# Patient Record
Sex: Female | Born: 1950 | Race: Black or African American | Hispanic: No | Smoking: Former smoker
Health system: Southern US, Community
[De-identification: ages and names within clinical notes are randomized; demographics above are authoritative.]

## PROBLEM LIST (undated history)

## (undated) DIAGNOSIS — R011 Cardiac murmur, unspecified: Secondary | ICD-10-CM

## (undated) DIAGNOSIS — R112 Nausea with vomiting, unspecified: Secondary | ICD-10-CM

## (undated) DIAGNOSIS — M1990 Unspecified osteoarthritis, unspecified site: Secondary | ICD-10-CM

## (undated) DIAGNOSIS — I10 Essential (primary) hypertension: Secondary | ICD-10-CM

## (undated) DIAGNOSIS — R51 Headache: Secondary | ICD-10-CM

## (undated) DIAGNOSIS — Z9889 Other specified postprocedural states: Secondary | ICD-10-CM

## (undated) HISTORY — PX: CARPAL TUNNEL RELEASE: SHX101

## (undated) HISTORY — PX: CHOLECYSTECTOMY: SHX55

## (undated) HISTORY — PX: BREAST BIOPSY: SHX20

## (undated) HISTORY — PX: CERVICAL FUSION: SHX112

## (undated) HISTORY — PX: ABDOMINAL HYSTERECTOMY: SHX81

---

## 2008-09-17 ENCOUNTER — Encounter: Admission: RE | Admit: 2008-09-17 | Discharge: 2008-09-17 | Payer: Self-pay | Admitting: Family Medicine

## 2010-05-29 ENCOUNTER — Ambulatory Visit: Payer: Self-pay | Admitting: Diagnostic Radiology

## 2010-05-29 ENCOUNTER — Ambulatory Visit (HOSPITAL_BASED_OUTPATIENT_CLINIC_OR_DEPARTMENT_OTHER): Admission: RE | Admit: 2010-05-29 | Discharge: 2010-05-29 | Payer: Self-pay | Admitting: Family Medicine

## 2010-06-12 ENCOUNTER — Ambulatory Visit (HOSPITAL_BASED_OUTPATIENT_CLINIC_OR_DEPARTMENT_OTHER)
Admission: RE | Admit: 2010-06-12 | Discharge: 2010-06-12 | Payer: Self-pay | Source: Home / Self Care | Admitting: Family Medicine

## 2010-07-21 ENCOUNTER — Inpatient Hospital Stay (HOSPITAL_COMMUNITY)
Admission: RE | Admit: 2010-07-21 | Discharge: 2010-07-24 | Payer: Self-pay | Source: Home / Self Care | Attending: Neurosurgery | Admitting: Neurosurgery

## 2010-07-26 LAB — GLUCOSE, CAPILLARY
Glucose-Capillary: 145 mg/dL — ABNORMAL HIGH (ref 70–99)
Glucose-Capillary: 158 mg/dL — ABNORMAL HIGH (ref 70–99)
Glucose-Capillary: 159 mg/dL — ABNORMAL HIGH (ref 70–99)
Glucose-Capillary: 161 mg/dL — ABNORMAL HIGH (ref 70–99)
Glucose-Capillary: 172 mg/dL — ABNORMAL HIGH (ref 70–99)
Glucose-Capillary: 175 mg/dL — ABNORMAL HIGH (ref 70–99)
Glucose-Capillary: 196 mg/dL — ABNORMAL HIGH (ref 70–99)
Glucose-Capillary: 212 mg/dL — ABNORMAL HIGH (ref 70–99)
Glucose-Capillary: 118 mg/dL — ABNORMAL HIGH (ref 70–99)
Glucose-Capillary: 125 mg/dL — ABNORMAL HIGH (ref 70–99)
Glucose-Capillary: 131 mg/dL — ABNORMAL HIGH (ref 70–99)
Glucose-Capillary: 131 mg/dL — ABNORMAL HIGH (ref 70–99)
Glucose-Capillary: 132 mg/dL — ABNORMAL HIGH (ref 70–99)
Glucose-Capillary: 170 mg/dL — ABNORMAL HIGH (ref 70–99)
Glucose-Capillary: 273 mg/dL — ABNORMAL HIGH (ref 70–99)

## 2010-07-26 LAB — BASIC METABOLIC PANEL
BUN: 16 mg/dL (ref 6–23)
CO2: 30 mEq/L (ref 19–32)
Calcium: 10.1 mg/dL (ref 8.4–10.5)
Chloride: 98 mEq/L (ref 96–112)
Creatinine, Ser: 1.16 mg/dL (ref 0.4–1.2)
GFR calc Af Amer: 58 mL/min — ABNORMAL LOW (ref >60)
GFR calc non Af Amer: 48 mL/min — ABNORMAL LOW (ref >60)
Glucose, Bld: 144 mg/dL — ABNORMAL HIGH (ref 70–99)
Potassium: 4.2 mEq/L (ref 3.5–5.1)
Sodium: 139 mEq/L (ref 135–145)

## 2010-07-26 LAB — CBC
HCT: 38.4 % (ref 36.0–46.0)
Hemoglobin: 12.4 g/dL (ref 12.0–15.0)
MCH: 28.4 pg (ref 26.0–34.0)
MCHC: 32.3 g/dL (ref 30.0–36.0)
MCV: 87.9 fL (ref 78.0–100.0)
Platelets: 271 10*3/uL (ref 150–400)
RBC: 4.37 MIL/uL (ref 3.87–5.11)
RDW: 13 % (ref 11.5–15.5)
WBC: 5 10*3/uL (ref 4.0–10.5)

## 2010-07-26 LAB — SURGICAL PCR SCREEN
MRSA, PCR: NEGATIVE
Staphylococcus aureus: NEGATIVE

## 2010-08-02 NOTE — Discharge Summary (Signed)
  NAME:  Audrey [name], Audrey [name]            ACCOUNT NO.:  [id number]  MEDICAL RECORD NO.:  [id number]          PATIENT TYPE:  INP  LOCATION:  3033                         FACILITY:  [facility]  PHYSICIAN:  Hilda Lias, M.D.   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  CLINICAL HISTORY:  The patient was admitted because of pain, weakness and difficulty with balance.  X-ray showed that she had severe stenosis from C3-C6.  Surgery was advised.  Laboratory normal.  COURSE IN THE HOSPITAL:  The patient was taken to Surgery, decompression at the level of 3-4, 4-5, 5-6 fusion was done.  Today, she is feeling much better.  She is able to walk with less discomfort and more stating. The x-ray from yesterday showed good position of the graft at the plate. She is going to be discharged today to be followed by me in my office.  CONDITION ON DISCHARGE:  Improvement.  MEDICATIONS:  Percocet and diazepam.  DIET:  Regular.  ACTIVITY:  Not to drive for at least 2 weeks.  FOLLOWUP:  Three weeks.          ______________________________ Hilda Lias, M.D.     EB/MEDQ  D:  07/24/2010  T:  07/25/2010  Job:  [id number]  Electronically Signed by Hilda Lias M.D. on 08/02/2010 05:21:55 PM

## 2012-01-26 ENCOUNTER — Other Ambulatory Visit: Payer: Self-pay | Admitting: Neurosurgery

## 2012-03-16 ENCOUNTER — Encounter (HOSPITAL_COMMUNITY): Payer: Self-pay | Admitting: Pharmacy Technician

## 2012-03-19 ENCOUNTER — Encounter (HOSPITAL_COMMUNITY)
Admission: RE | Admit: 2012-03-19 | Discharge: 2012-03-19 | Disposition: A | Discharge status: Home / Self Care | Payer: BC Managed Care – PPO | Source: Ambulatory Visit | Attending: Neurosurgery | Admitting: Neurosurgery

## 2012-03-19 ENCOUNTER — Ambulatory Visit (HOSPITAL_COMMUNITY)
Admission: RE | Admit: 2012-03-19 | Discharge: 2012-03-19 | Disposition: A | Discharge status: Home / Self Care | Payer: BC Managed Care – PPO | Source: Ambulatory Visit | Attending: Neurosurgery | Admitting: Neurosurgery

## 2012-03-19 ENCOUNTER — Encounter (HOSPITAL_COMMUNITY): Payer: Self-pay

## 2012-03-19 DIAGNOSIS — Z01818 Encounter for other preprocedural examination: Secondary | ICD-10-CM | POA: Insufficient documentation

## 2012-03-19 DIAGNOSIS — Z01812 Encounter for preprocedural laboratory examination: Secondary | ICD-10-CM | POA: Insufficient documentation

## 2012-03-19 DIAGNOSIS — Z01810 Encounter for preprocedural cardiovascular examination: Secondary | ICD-10-CM | POA: Insufficient documentation

## 2012-03-19 HISTORY — DX: Headache: R51

## 2012-03-19 HISTORY — DX: Unspecified osteoarthritis, unspecified site: M19.90

## 2012-03-19 HISTORY — DX: Essential (primary) hypertension: I10

## 2012-03-19 HISTORY — DX: Other specified postprocedural states: Z98.890

## 2012-03-19 HISTORY — DX: Other specified postprocedural states: R11.2

## 2012-03-19 LAB — BASIC METABOLIC PANEL
Calcium: 10.4 mg/dL (ref 8.4–10.5)
Creatinine, Ser: 1.17 mg/dL — ABNORMAL HIGH (ref 0.50–1.10)
GFR calc Af Amer: 57 mL/min — ABNORMAL LOW (ref >90)
GFR calc non Af Amer: 49 mL/min — ABNORMAL LOW (ref >90)
Sodium: 138 mEq/L (ref 135–145)

## 2012-03-19 LAB — SURGICAL PCR SCREEN
MRSA, PCR: NEGATIVE
Staphylococcus aureus: NEGATIVE

## 2012-03-19 LAB — CBC
MCV: 85.5 fL (ref 78.0–100.0)
Platelets: 229 10*3/uL (ref 150–400)
RDW: 13.5 % (ref 11.5–15.5)
WBC: 5.6 10*3/uL (ref 4.0–10.5)

## 2012-03-19 LAB — TYPE AND SCREEN: ABO/RH(D): O POS

## 2012-03-19 NOTE — Pre-Procedure Instructions (Addendum)
20 ELIRA COLASANTI  03/19/2012   Your procedure is scheduled on:  9/20  Report to [facility] at530 AM.  Call this number if you have problems the morning of surgery: [phone]   Remember:   Do not eat food or drink:After Midnight.    Take these medicines the morning of surgery with A SIP OF WATER: , zyrtec, metoprolol-hydrochlorithiazide, nasonex             Stop any aspirin, herbal meds, nsaids 5-7 days before surgery  Do not wear jewelry, make-up or nail polish.  Do not wear lotions, powders, or perfumes. You may wear deodorant.  Do not shave 48 hours prior to surgery. Men may shave face and neck.  Do not bring valuables to the hospital.  Contacts, dentures or bridgework may not be worn into surgery.  Leave suitcase in the car. After surgery it may be brought to your room.  For patients admitted to the hospital, checkout time is 11:00 AM the day of discharge.   Patients discharged the day of surgery will not be allowed to drive home.  Name and phone number of your driver:   Special Instructions: CHG Shower Use Special Wash: 1/2 bottle night before surgery and 1/2 bottle morning of surgery.   Please read over the following fact sheets that you were given: Pain Booklet, Coughing and Deep Breathing, Blood Transfusion Information and MRSA Information

## 2012-03-19 NOTE — Progress Notes (Signed)
REQUESTED STRESS TEST, EKG, ECHO FROM 2010  (DR FRIED )  [phone].

## 2012-03-29 MED ORDER — CEFAZOLIN SODIUM-DEXTROSE 2-3 GM-% IV SOLR
2.0000 g | INTRAVENOUS | Status: AC
  Administered 2012-03-30: 2 g via INTRAVENOUS

## 2012-03-30 ENCOUNTER — Encounter (HOSPITAL_COMMUNITY): Payer: Self-pay | Admitting: *Deleted

## 2012-03-30 ENCOUNTER — Ambulatory Visit (HOSPITAL_COMMUNITY): Payer: BC Managed Care – PPO

## 2012-03-30 ENCOUNTER — Encounter (HOSPITAL_COMMUNITY): Payer: Self-pay | Admitting: Anesthesiology

## 2012-03-30 ENCOUNTER — Encounter (HOSPITAL_COMMUNITY)
Admission: RE | Disposition: A | Discharge status: Home Health | Payer: Self-pay | Source: Ambulatory Visit | Attending: Neurosurgery

## 2012-03-30 ENCOUNTER — Inpatient Hospital Stay (HOSPITAL_COMMUNITY)
Admission: RE | Admit: 2012-03-30 | Discharge: 2012-04-04 | DRG: 756 | Disposition: A | Discharge status: Home Health | Payer: BC Managed Care – PPO | Source: Ambulatory Visit | Attending: Neurosurgery | Admitting: Neurosurgery

## 2012-03-30 ENCOUNTER — Ambulatory Visit (HOSPITAL_COMMUNITY): Payer: BC Managed Care – PPO | Admitting: Anesthesiology

## 2012-03-30 DIAGNOSIS — E119 Type 2 diabetes mellitus without complications: Secondary | ICD-10-CM | POA: Diagnosis present

## 2012-03-30 DIAGNOSIS — M48062 Spinal stenosis, lumbar region with neurogenic claudication: Principal | ICD-10-CM | POA: Diagnosis present

## 2012-03-30 DIAGNOSIS — I10 Essential (primary) hypertension: Secondary | ICD-10-CM | POA: Diagnosis present

## 2012-03-30 DIAGNOSIS — Z79899 Other long term (current) drug therapy: Secondary | ICD-10-CM

## 2012-03-30 DIAGNOSIS — M129 Arthropathy, unspecified: Secondary | ICD-10-CM | POA: Diagnosis present

## 2012-03-30 DIAGNOSIS — Q762 Congenital spondylolisthesis: Secondary | ICD-10-CM

## 2012-03-30 DIAGNOSIS — Z981 Arthrodesis status: Secondary | ICD-10-CM

## 2012-03-30 DIAGNOSIS — E669 Obesity, unspecified: Secondary | ICD-10-CM | POA: Diagnosis present

## 2012-03-30 LAB — GLUCOSE, CAPILLARY: Glucose-Capillary: 141 mg/dL — ABNORMAL HIGH (ref 70–99)

## 2012-03-30 SURGERY — POSTERIOR LUMBAR FUSION 1 LEVEL
Anesthesia: General | Site: Back | Wound class: Clean

## 2012-03-30 MED ORDER — DIPHENHYDRAMINE HCL 12.5 MG/5ML PO ELIX: 12.5000 mg | ORAL_SOLUTION | Freq: Four times a day (QID) | ORAL | Status: DC | PRN

## 2012-03-30 MED ORDER — NALOXONE HCL 0.4 MG/ML IJ SOLN: 0.4000 mg | INTRAMUSCULAR | Status: DC | PRN

## 2012-03-30 MED ORDER — SCOPOLAMINE 1 MG/3DAYS TD PT72
MEDICATED_PATCH | TRANSDERMAL | Status: AC
  Administered 2012-03-30: 1 via TRANSDERMAL
  Filled 2012-03-30: qty 1

## 2012-03-30 MED ORDER — MORPHINE SULFATE (PF) 1 MG/ML IV SOLN
INTRAVENOUS | Status: AC
  Filled 2012-03-30: qty 25

## 2012-03-30 MED ORDER — EPHEDRINE SULFATE 50 MG/ML IJ SOLN: INTRAMUSCULAR | Status: DC | PRN

## 2012-03-30 MED ORDER — ONDANSETRON HCL 4 MG/2ML IJ SOLN: 4.0000 mg | Freq: Four times a day (QID) | INTRAMUSCULAR | Status: DC | PRN

## 2012-03-30 MED ORDER — ONDANSETRON HCL 4 MG/2ML IJ SOLN
INTRAMUSCULAR | Status: DC | PRN
  Administered 2012-03-30: 4 mg via INTRAVENOUS

## 2012-03-30 MED ORDER — MORPHINE SULFATE (PF) 1 MG/ML IV SOLN
INTRAVENOUS | Status: DC
  Administered 2012-03-30: 17:00:00 via INTRAVENOUS
  Administered 2012-03-30: 4.5 mg via INTRAVENOUS
  Administered 2012-03-30: 1 mg via INTRAVENOUS
  Administered 2012-03-31: 3 mg via INTRAVENOUS
  Administered 2012-03-31: 4.09 mg via INTRAVENOUS
  Administered 2012-03-31: 1.5 mg via INTRAVENOUS
  Administered 2012-03-31 (×2): 6 mg via INTRAVENOUS
  Administered 2012-03-31 – 2012-04-01 (×2): 1.5 mg via INTRAVENOUS
  Filled 2012-03-30: qty 25

## 2012-03-30 MED ORDER — SODIUM CHLORIDE 0.9 % IJ SOLN: 9.0000 mL | INTRAMUSCULAR | Status: DC | PRN

## 2012-03-30 MED ORDER — SCOPOLAMINE 1 MG/3DAYS TD PT72
MEDICATED_PATCH | TRANSDERMAL | Status: AC
  Filled 2012-03-30: qty 1

## 2012-03-30 MED ORDER — NEOSTIGMINE METHYLSULFATE 1 MG/ML IJ SOLN
INTRAMUSCULAR | Status: DC | PRN
  Administered 2012-03-30: 4 mg via INTRAVENOUS

## 2012-03-30 MED ORDER — LIDOCAINE HCL (CARDIAC) 20 MG/ML IV SOLN
INTRAVENOUS | Status: DC | PRN
  Administered 2012-03-30: 75 mg via INTRAVENOUS

## 2012-03-30 MED ORDER — HEPARIN SODIUM (PORCINE) 1000 UNIT/ML IJ SOLN
INTRAMUSCULAR | Status: AC
  Filled 2012-03-30: qty 1

## 2012-03-30 MED ORDER — PROPOFOL 10 MG/ML IV BOLUS
INTRAVENOUS | Status: DC | PRN
  Administered 2012-03-30: 180 mg via INTRAVENOUS

## 2012-03-30 MED ORDER — DIPHENHYDRAMINE HCL 50 MG/ML IJ SOLN: 12.5000 mg | Freq: Four times a day (QID) | INTRAMUSCULAR | Status: DC | PRN

## 2012-03-30 MED ORDER — HYDROMORPHONE HCL PF 1 MG/ML IJ SOLN
0.2500 mg | INTRAMUSCULAR | Status: DC | PRN
  Administered 2012-03-30 (×3): 0.5 mg via INTRAVENOUS

## 2012-03-30 MED ORDER — METOPROLOL TARTRATE 50 MG PO TABS
50.0000 mg | ORAL_TABLET | Freq: Two times a day (BID) | ORAL | Status: DC
  Administered 2012-03-30 – 2012-04-04 (×9): 50 mg via ORAL
  Filled 2012-03-30 (×12): qty 1

## 2012-03-30 MED ORDER — SODIUM CHLORIDE 0.9 % IJ SOLN: 3.0000 mL | INTRAMUSCULAR | Status: DC | PRN

## 2012-03-30 MED ORDER — SODIUM CHLORIDE 0.9 % IV SOLN
INTRAVENOUS | Status: DC
  Administered 2012-03-30 – 2012-03-31 (×2): via INTRAVENOUS

## 2012-03-30 MED ORDER — HYDROCHLOROTHIAZIDE 25 MG PO TABS
25.0000 mg | ORAL_TABLET | Freq: Two times a day (BID) | ORAL | Status: DC
  Administered 2012-03-30 – 2012-04-04 (×10): 25 mg via ORAL
  Filled 2012-03-30 (×12): qty 1

## 2012-03-30 MED ORDER — 0.9 % SODIUM CHLORIDE (POUR BTL) OPTIME
TOPICAL | Status: DC | PRN
  Administered 2012-03-30: 1000 mL

## 2012-03-30 MED ORDER — METFORMIN HCL 500 MG PO TABS: 1000.0000 mg | ORAL_TABLET | Freq: Every day | ORAL | Status: DC

## 2012-03-30 MED ORDER — EPHEDRINE SULFATE 50 MG/ML IJ SOLN
INTRAMUSCULAR | Status: DC | PRN
  Administered 2012-03-30: 5 mg via INTRAVENOUS

## 2012-03-30 MED ORDER — AMLODIPINE BESY-BENAZEPRIL HCL 5-20 MG PO CAPS
1.0000 | ORAL_CAPSULE | Freq: Every day | ORAL | Status: DC
Start: 2012-03-30 — End: 2012-03-30

## 2012-03-30 MED ORDER — ACETAMINOPHEN 650 MG RE SUPP: 650.0000 mg | RECTAL | Status: DC | PRN

## 2012-03-30 MED ORDER — BENAZEPRIL HCL 20 MG PO TABS
20.0000 mg | ORAL_TABLET | Freq: Every day | ORAL | Status: DC
  Administered 2012-03-31 – 2012-04-04 (×3): 20 mg via ORAL
  Filled 2012-03-30 (×5): qty 1

## 2012-03-30 MED ORDER — FENTANYL CITRATE 0.05 MG/ML IJ SOLN
INTRAMUSCULAR | Status: DC | PRN
  Administered 2012-03-30: 50 ug via INTRAVENOUS
  Administered 2012-03-30: 100 ug via INTRAVENOUS
  Administered 2012-03-30 (×3): 50 ug via INTRAVENOUS

## 2012-03-30 MED ORDER — HYDROMORPHONE HCL PF 1 MG/ML IJ SOLN
INTRAMUSCULAR | Status: AC
  Administered 2012-03-30: 0.5 mg via INTRAVENOUS
  Filled 2012-03-30: qty 1

## 2012-03-30 MED ORDER — MENTHOL 3 MG MT LOZG: 1.0000 | LOZENGE | OROMUCOSAL | Status: DC | PRN

## 2012-03-30 MED ORDER — ROCURONIUM BROMIDE 100 MG/10ML IV SOLN
INTRAVENOUS | Status: DC | PRN
  Administered 2012-03-30: 50 mg via INTRAVENOUS

## 2012-03-30 MED ORDER — MIDAZOLAM HCL 5 MG/5ML IJ SOLN
INTRAMUSCULAR | Status: DC | PRN
  Administered 2012-03-30: 2 mg via INTRAVENOUS

## 2012-03-30 MED ORDER — METOPROLOL-HYDROCHLOROTHIAZIDE 50-25 MG PO TABS: 1.0000 | ORAL_TABLET | Freq: Two times a day (BID) | ORAL | Status: DC

## 2012-03-30 MED ORDER — ACETAMINOPHEN 325 MG PO TABS: 650.0000 mg | ORAL_TABLET | ORAL | Status: DC | PRN

## 2012-03-30 MED ORDER — THROMBIN 20000 UNITS EX KIT
PACK | CUTANEOUS | Status: DC | PRN
  Administered 2012-03-30: 20000 [IU] via TOPICAL

## 2012-03-30 MED ORDER — SODIUM CHLORIDE 0.9 % IV SOLN: 250.0000 mL | INTRAVENOUS | Status: DC

## 2012-03-30 MED ORDER — OXYCODONE-ACETAMINOPHEN 5-325 MG PO TABS
1.0000 | ORAL_TABLET | ORAL | Status: DC | PRN
  Administered 2012-03-31 – 2012-04-01 (×4): 1 via ORAL
  Administered 2012-04-02 (×3): 2 via ORAL
  Administered 2012-04-02: 1 via ORAL
  Administered 2012-04-03 – 2012-04-04 (×5): 2 via ORAL
  Filled 2012-03-30: qty 1
  Filled 2012-03-30 (×2): qty 2
  Filled 2012-03-30: qty 1
  Filled 2012-03-30 (×2): qty 2
  Filled 2012-03-30: qty 1
  Filled 2012-03-30: qty 2
  Filled 2012-03-30 (×2): qty 1
  Filled 2012-03-30 (×3): qty 2

## 2012-03-30 MED ORDER — ONDANSETRON HCL 4 MG/2ML IJ SOLN: 4.0000 mg | INTRAMUSCULAR | Status: DC | PRN

## 2012-03-30 MED ORDER — PHENYLEPHRINE HCL 10 MG/ML IJ SOLN
INTRAMUSCULAR | Status: DC | PRN
  Administered 2012-03-30 (×5): 80 ug via INTRAVENOUS

## 2012-03-30 MED ORDER — AMLODIPINE BESYLATE 5 MG PO TABS
5.0000 mg | ORAL_TABLET | Freq: Every day | ORAL | Status: DC
  Administered 2012-03-31 – 2012-04-04 (×3): 5 mg via ORAL
  Filled 2012-03-30 (×2): qty 1

## 2012-03-30 MED ORDER — PHENOL 1.4 % MT LIQD: 1.0000 | OROMUCOSAL | Status: DC | PRN

## 2012-03-30 MED ORDER — BUPIVACAINE-EPINEPHRINE PF 0.5-1:200000 % IJ SOLN
INTRAMUSCULAR | Status: DC | PRN
  Administered 2012-03-30: 30 mL

## 2012-03-30 MED ORDER — DIAZEPAM 5 MG PO TABS
5.0000 mg | ORAL_TABLET | Freq: Four times a day (QID) | ORAL | Status: DC | PRN
  Administered 2012-03-31 – 2012-04-04 (×6): 5 mg via ORAL
  Filled 2012-03-30 (×6): qty 1

## 2012-03-30 MED ORDER — METFORMIN HCL 500 MG PO TABS
1000.0000 mg | ORAL_TABLET | Freq: Every day | ORAL | Status: DC
  Administered 2012-03-31 – 2012-04-04 (×5): 1000 mg via ORAL
  Filled 2012-03-30 (×7): qty 2

## 2012-03-30 MED ORDER — HEMOSTATIC AGENTS (NO CHARGE) OPTIME
TOPICAL | Status: DC | PRN
  Administered 2012-03-30: 1 via TOPICAL

## 2012-03-30 MED ORDER — CEFAZOLIN SODIUM 1-5 GM-% IV SOLN
1.0000 g | Freq: Three times a day (TID) | INTRAVENOUS | Status: AC
  Administered 2012-03-30 – 2012-03-31 (×2): 1 g via INTRAVENOUS
  Filled 2012-03-30 (×2): qty 50

## 2012-03-30 MED ORDER — GLYCOPYRROLATE 0.2 MG/ML IJ SOLN
INTRAMUSCULAR | Status: DC | PRN
  Administered 2012-03-30: 0.6 mg via INTRAVENOUS

## 2012-03-30 MED ORDER — LACTATED RINGERS IV SOLN
INTRAVENOUS | Status: DC | PRN
  Administered 2012-03-30 (×3): via INTRAVENOUS

## 2012-03-30 MED ORDER — ZOLPIDEM TARTRATE 10 MG PO TABS: 10.0000 mg | ORAL_TABLET | Freq: Every evening | ORAL | Status: DC | PRN

## 2012-03-30 MED ORDER — SODIUM CHLORIDE 0.9 % IJ SOLN
3.0000 mL | Freq: Two times a day (BID) | INTRAMUSCULAR | Status: DC
  Administered 2012-03-30 – 2012-03-31 (×2): 3 mL via INTRAVENOUS

## 2012-03-30 SURGICAL SUPPLY — 74 items
APL SKNCLS STERI-STRIP NONHPOA (GAUZE/BANDAGES/DRESSINGS) ×1
BENZOIN TINCTURE PRP APPL 2/3 (GAUZE/BANDAGES/DRESSINGS) ×2 IMPLANT
BLADE SURG ROTATE 9660 (MISCELLANEOUS) IMPLANT
BONE EQUIVA 5CC (Bone Implant) ×1 IMPLANT
BUR ACORN 6.0 (BURR) ×2 IMPLANT
BUR MATCHSTICK NEURO 3.0 LAGG (BURR) ×2 IMPLANT
CANISTER SUCTION 2500CC (MISCELLANEOUS) ×2 IMPLANT
CAP REVERE LOCKING (Cap) ×4 IMPLANT
CLOTH BEACON ORANGE TIMEOUT ST (SAFETY) ×2 IMPLANT
CONN CROSSLINK REV 38-50MM (Connector) ×2 IMPLANT
CONNECTOR CRSLINK REV 38-50MM (Connector) IMPLANT
CONT SPEC 4OZ CLIKSEAL STRL BL (MISCELLANEOUS) ×2 IMPLANT
COVER BACK TABLE 24X17X13 BIG (DRAPES) IMPLANT
COVER TABLE BACK 60X90 (DRAPES) ×2 IMPLANT
DRAPE C-ARM 42X72 X-RAY (DRAPES) ×4 IMPLANT
DRAPE LAPAROTOMY 100X72X124 (DRAPES) ×2 IMPLANT
DRAPE POUCH INSTRU U-SHP 10X18 (DRAPES) ×2 IMPLANT
DRSG PAD ABDOMINAL 8X10 ST (GAUZE/BANDAGES/DRESSINGS) ×1 IMPLANT
DURAPREP 26ML APPLICATOR (WOUND CARE) ×2 IMPLANT
ELECT BLADE 4.0 EZ CLEAN MEGAD (MISCELLANEOUS) ×2
ELECT REM PT RETURN 9FT ADLT (ELECTROSURGICAL) ×2
ELECTRODE BLDE 4.0 EZ CLN MEGD (MISCELLANEOUS) IMPLANT
ELECTRODE REM PT RTRN 9FT ADLT (ELECTROSURGICAL) ×1 IMPLANT
EVACUATOR 1/8 PVC DRAIN (DRAIN) IMPLANT
GAUZE SPONGE 4X4 16PLY XRAY LF (GAUZE/BANDAGES/DRESSINGS) ×3 IMPLANT
GLOVE BIOGEL M 8.0 STRL (GLOVE) ×2 IMPLANT
GLOVE BIOGEL PI IND STRL 8 (GLOVE) IMPLANT
GLOVE BIOGEL PI IND STRL 8.5 (GLOVE) IMPLANT
GLOVE BIOGEL PI INDICATOR 8 (GLOVE) ×1
GLOVE BIOGEL PI INDICATOR 8.5 (GLOVE) ×1
GLOVE ECLIPSE 7.5 STRL STRAW (GLOVE) ×4 IMPLANT
GLOVE ECLIPSE 8.5 STRL (GLOVE) ×1 IMPLANT
GLOVE EXAM NITRILE LRG STRL (GLOVE) IMPLANT
GLOVE EXAM NITRILE MD LF STRL (GLOVE) ×1 IMPLANT
GLOVE EXAM NITRILE XL STR (GLOVE) IMPLANT
GLOVE EXAM NITRILE XS STR PU (GLOVE) IMPLANT
GOWN BRE IMP SLV AUR LG STRL (GOWN DISPOSABLE) ×2 IMPLANT
GOWN BRE IMP SLV AUR XL STRL (GOWN DISPOSABLE) IMPLANT
GOWN STRL REIN 2XL LVL4 (GOWN DISPOSABLE) ×2 IMPLANT
KIT BASIN OR (CUSTOM PROCEDURE TRAY) ×2 IMPLANT
KIT ROOM TURNOVER OR (KITS) ×2 IMPLANT
MILL MEDIUM DISP (BLADE) ×1 IMPLANT
NDL HYPO 18GX1.5 BLUNT FILL (NEEDLE) IMPLANT
NDL HYPO 21X1.5 SAFETY (NEEDLE) IMPLANT
NDL HYPO 25X1 1.5 SAFETY (NEEDLE) IMPLANT
NEEDLE HYPO 18GX1.5 BLUNT FILL (NEEDLE) IMPLANT
NEEDLE HYPO 21X1.5 SAFETY (NEEDLE) IMPLANT
NEEDLE HYPO 25X1 1.5 SAFETY (NEEDLE) IMPLANT
NS IRRIG 1000ML POUR BTL (IV SOLUTION) ×2 IMPLANT
PACK LAMINECTOMY NEURO (CUSTOM PROCEDURE TRAY) ×2 IMPLANT
PAD ARMBOARD 7.5X6 YLW CONV (MISCELLANEOUS) ×6 IMPLANT
PATTIES SURGICAL .5 X1 (DISPOSABLE) ×2 IMPLANT
PATTIES SURGICAL .5 X3 (DISPOSABLE) IMPLANT
ROD REVERE 6.35 45MM (Rod) ×2 IMPLANT
SCREW REVERE 6.35 6.5MMX45 (Screw) ×2 IMPLANT
SCREW REVERE 6.5X50MM (Screw) ×2 IMPLANT
SPACER CALIBER 10X22 12-17 (Spacer) ×2 IMPLANT
SPONGE GAUZE 4X4 12PLY (GAUZE/BANDAGES/DRESSINGS) ×2 IMPLANT
SPONGE LAP 4X18 X RAY DECT (DISPOSABLE) IMPLANT
SPONGE NEURO XRAY DETECT 1X3 (DISPOSABLE) IMPLANT
SPONGE SURGIFOAM ABS GEL 100 (HEMOSTASIS) ×2 IMPLANT
STRIP CLOSURE SKIN 1/2X4 (GAUZE/BANDAGES/DRESSINGS) ×2 IMPLANT
SUT VIC AB 1 CT1 18XBRD ANBCTR (SUTURE) ×2 IMPLANT
SUT VIC AB 1 CT1 8-18 (SUTURE) ×4
SUT VIC AB 2-0 CP2 18 (SUTURE) ×2 IMPLANT
SUT VIC AB 3-0 SH 8-18 (SUTURE) ×2 IMPLANT
SYR 20CC LL (SYRINGE) IMPLANT
SYR 20ML ECCENTRIC (SYRINGE) ×2 IMPLANT
SYR 5ML LL (SYRINGE) IMPLANT
TAPE CLOTH SURG 4X10 WHT LF (GAUZE/BANDAGES/DRESSINGS) ×1 IMPLANT
TOWEL OR 17X24 6PK STRL BLUE (TOWEL DISPOSABLE) ×2 IMPLANT
TOWEL OR 17X26 10 PK STRL BLUE (TOWEL DISPOSABLE) ×2 IMPLANT
TRAY FOLEY CATH 14FRSI W/METER (CATHETERS) ×2 IMPLANT
WATER STERILE IRR 1000ML POUR (IV SOLUTION) ×2 IMPLANT

## 2012-03-30 NOTE — Anesthesia Postprocedure Evaluation (Signed)
Anesthesia Post Note  Patient: Audrey [name]  Procedure(s) Performed: Procedure(s) (LRB): POSTERIOR LUMBAR FUSION 1 LEVEL (N/A)  Anesthesia type: General  Patient location: PACU  Post pain: Pain level controlled and Adequate analgesia  Post assessment: Post-op Vital signs reviewed, Patient's Cardiovascular Status Stable, Respiratory Function Stable, Patent Airway and Pain level controlled  Last Vitals:  Filed Vitals:   03/30/12 1330  BP: 106/79  Pulse: 73  Temp:   Resp: 10    Post vital signs: Reviewed and stable  Level of consciousness: awake, alert  and oriented  Complications: No apparent anesthesia complications

## 2012-03-30 NOTE — Transfer of Care (Signed)
Immediate Anesthesia Transfer of Care Note  Patient: VIRGILIA QUIGG  Procedure(s) Performed: Procedure(s) (LRB) with comments: POSTERIOR LUMBAR FUSION 1 LEVEL (N/A) - Lumbar three , four , five  Laminectomy, Lumbar four - five Diskectomy, Cages, Pedicle screws, Posterolateral arthrodesis,   Patient Location: PACU  Anesthesia Type: General  Level of Consciousness: awake, alert , oriented and sedated  Airway & Oxygen Therapy: Patient Spontanous Breathing and Patient connected to nasal cannula oxygen  Post-op Assessment: Report given to PACU RN, Post -op Vital signs reviewed and stable and Patient moving all extremities  Post vital signs: Reviewed and stable  Complications: No apparent anesthesia complications

## 2012-03-30 NOTE — Anesthesia Preprocedure Evaluation (Signed)
Anesthesia Evaluation  Patient identified by MRN, date of birth, ID band Patient awake    Reviewed: Allergy & Precautions, H&P , NPO status , Patient's Chart, lab work & pertinent test results  History of Anesthesia Complications (+) PONV  Airway Mallampati: II  Neck ROM: full    Dental   Pulmonary          Cardiovascular hypertension,     Neuro/Psych  Headaches,    GI/Hepatic   Endo/Other  diabetesobese  Renal/GU      Musculoskeletal  (+) Arthritis -,   Abdominal   Peds  Hematology   Anesthesia Other Findings   Reproductive/Obstetrics                           Anesthesia Physical Anesthesia Plan  ASA: II  Anesthesia Plan: General   Post-op Pain Management:    Induction: Intravenous  Airway Management Planned: Oral ETT  Additional Equipment:   Intra-op Plan:   Post-operative Plan: Extubation in OR  Informed Consent: I have reviewed the patients History and Physical, chart, labs and discussed the procedure including the risks, benefits and alternatives for the proposed anesthesia with the patient or authorized representative who has indicated his/her understanding and acceptance.     Plan Discussed with: CRNA and Surgeon  Anesthesia Plan Comments:         Anesthesia Quick Evaluation

## 2012-03-30 NOTE — Preoperative (Signed)
Beta Blockers   Reason not to administer Beta Blockers:Not Applicable 

## 2012-03-30 NOTE — H&P (Signed)
Audrey [name] is an 61 y.o. female.   Chief Complaint: lbp HPI: lbp with radiation to both legs associated with numbness which is getting worse. She has had conservative treatment including epidurals.  Past Medical History  Diagnosis Date  . PONV (postoperative nausea and vomiting)   . Hypertension   . Diabetes mellitus   . Headache   . Arthritis     Past Surgical History  Procedure Date  . Cervical fusion     2 yrs ago   at [facility]  . Carpal tunnel release     right hand  . Abdominal hysterectomy   . Breast biopsy     right    History reviewed. No pertinent family history. Social History:  reports that she has quit smoking. She does not have any smokeless tobacco history on file. She reports that she does not drink alcohol or use illicit drugs.  Allergies: No Known Allergies  Medications Prior to Admission  Medication Sig Dispense Refill  . amLODipine-benazepril (LOTREL) 5-20 MG per capsule Take 1 capsule by mouth daily.      . Calcium-Magnesium-Zinc 167-83-8 MG TABS Take 1 tablet by mouth 2 (two) times daily.      . cetirizine (ZYRTEC) 10 MG tablet Take 10 mg by mouth daily as needed. allergies      . Cholecalciferol (VITAMIN D-3) 5000 UNITS TABS Take 1 tablet by mouth daily.      . metFORMIN (GLUCOPHAGE) 1000 MG tablet Take 1,000 mg by mouth daily.      . metoprolol-hydrochlorothiazide (LOPRESSOR HCT) 50-25 MG per tablet Take 1 tablet by mouth 2 (two) times daily.      . Multiple Vitamin (MULTIVITAMIN WITH MINERALS) TABS Take 1 tablet by mouth daily.      . mometasone (NASONEX) 50 MCG/ACT nasal spray Place 2 sprays into the nose daily as needed. Allergies        Results for orders placed during the hospital encounter of 03/30/12 (from the past 48 hour(s))  GLUCOSE, CAPILLARY     Status: Abnormal   Collection Time   03/30/12  6:23 AM      Component Value Range Comment   Glucose-Capillary 141 (*) 70 - 99 mg/dL    No results found.  Review of Systems  Respiratory:  Negative.   Cardiovascular: Negative.   Gastrointestinal: Negative.   Genitourinary: Negative.   Musculoskeletal: Positive for back pain.  Skin: Negative.   Neurological: Positive for focal weakness.  Endo/Heme/Allergies: Negative.   Psychiatric/Behavioral: Negative.     Blood pressure 146/85, pulse 68, temperature 98.3 F (36.8 C), temperature source Oral, resp. rate 16, SpO2 100.00%. Physical Exam hent, nl. Neck, ant scar. Cv, nl. Lungs clear. Abdomen, nl, extremiters, nl. NEURO weakness of df both feet. Sitting improves the pain but standing make it worse. Radiological studies shoewd stenosis l3 to l5 with grade 1 spondylolisthesis at l45  Assessment/Plan Decompressive laminectomies l3 to l5 with fusion at l45. Patient aware of risks and benefits.  Aaban Griep M 03/30/2012, 7:48 AM

## 2012-03-30 NOTE — Progress Notes (Signed)
Or note [phone]

## 2012-03-31 LAB — GLUCOSE, CAPILLARY: Glucose-Capillary: 122 mg/dL — ABNORMAL HIGH (ref 70–99)

## 2012-03-31 NOTE — Evaluation (Signed)
Physical Therapy Evaluation Patient Details Name: Audrey [name] MRN: [id number] DOB: Aug 21, 1950 Today's Date: 03/31/2012 Time: 1124-1209 PT Time Calculation (min): 45 min  PT Assessment / Plan / Recommendation Clinical Impression  61 yo s/p L3-5 laminectomies and L4-5 fusion with dependencies in mobility due to back precautions and pain. Will benefit from PT to incr independence prior to d/c home with 24/7 assist.    PT Assessment  Patient needs continued PT services    Follow Up Recommendations  Home health PT;Supervision/Assistance - 24 hour (vs no PT follow-up depending on progress)    Barriers to Discharge None      Equipment Recommendations  Rolling walker with 5" wheels    Recommendations for Other Services OT consult   Frequency Min 5X/week    Precautions / Restrictions Precautions Precautions: Back Precaution Booklet Issued: Yes (comment) Required Braces or Orthoses: Spinal Brace Spinal Brace: Lumbar corset;Applied in sitting position   Pertinent Vitals/Pain 5/10 back pain SaO2 94-96% on RA       Mobility  Bed Mobility Bed Mobility: Rolling Right;Right Sidelying to Sit;Sitting - Scoot to [place] of Bed Rolling Right: 4: Min assist;With rail Right Sidelying to Sit: 4: Min assist;With rails;HOB flat Sitting - Scoot to [place] of Bed: 4: Min guard Details for Bed Mobility Assistance: vc for technique to maintain back precautions; assist to pelvis/legs to prevent twisting Transfers Transfers: Sit to Stand;Stand to Sit Sit to Stand: 4: Min assist;With upper extremity assist;From bed Stand to Sit: 4: Min assist;With upper extremity assist;With armrests;To chair/3-in-1 Details for Transfer Assistance: pt moving slowly due to pain and required min assist to fully extend hips/knees as coming to standing and transitioning hands up to RW Ambulation/Gait Ambulation/Gait Assistance: 4: Min assist Ambulation Distance (Feet): 4 Feet Assistive device: Rolling  walker Ambulation/Gait Assistance Details: Pt very fatigued after prolonged sitting EOB while RN in to inspect (and then change) dressing on her back (saturated). Very lethargic/groggy and further ambulation deferred Gait Pattern: Step-to pattern;Decreased stride length;Shuffle    Exercises     PT Diagnosis: Difficulty walking;Acute pain  PT Problem List: Decreased activity tolerance;Decreased mobility;Decreased knowledge of use of DME;Decreased knowledge of precautions;Pain PT Treatment Interventions: DME instruction;Gait training;Stair training;Functional mobility training;Therapeutic activities;Patient/family education   PT Goals Acute Rehab PT Goals PT Goal Formulation: With patient Time For Goal Achievement: 04/07/12 Potential to Achieve Goals: Good Pt will Roll Supine to Right Side: with supervision PT Goal: Rolling Supine to Right Side - Progress: Goal set today Pt will go Supine/Side to Sit: with supervision;with HOB 0 degrees PT Goal: Supine/Side to Sit - Progress: Goal set today Pt will go Sit to Supine/Side: with supervision;with HOB 0 degrees PT Goal: Sit to Supine/Side - Progress: Goal set today Pt will go Sit to Stand: with supervision;with upper extremity assist PT Goal: Sit to Stand - Progress: Goal set today Pt will go Stand to Sit: with supervision;with upper extremity assist PT Goal: Stand to Sit - Progress: Goal set today Pt will Ambulate: 51 - 150 feet;with supervision;with least restrictive assistive device PT Goal: Ambulate - Progress: Goal set today Additional Goals Additional Goal #1: Pt will be able to verbalize and adhere to back precautions while mobilizing PT Goal: Additional Goal #1 - Progress: Goal set today  Visit Information  Last PT Received On: 03/31/12 Assistance Needed: +1    Subjective Data  Subjective: Pt reports she had some numbness in bil feel Lt worse than Rt PTA Patient Stated Goal: get back to her walking routine  and lose some weight    Prior Functioning  Home Living Lives With: Daughter;Other (Comment) (granddaughter) Available Help at Discharge: Available 24 hours/day;Family Type of Home: House Home Access: Stairs to enter [employer] of Steps: 5 Entrance Stairs-Rails: Right;Left Home Layout: One level Bathroom Shower/Tub: Forensic scientist: Standard Bathroom Accessibility: Yes How Accessible: Accessible via walker Home Adaptive Equipment: None Prior Function Level of Independence: Independent Able to Take Stairs?: Yes Driving: Yes Vocation: Retired Musician: No difficulties    Cognition  Overall Cognitive Status: Appears within functional limits for tasks assessed/performed Orientation Level: Oriented X4 / Intact Behavior During Session: Lethargic Cognition - Other Comments: using PCA; easily drifts off to sleep    Extremity/Trunk Assessment Right Lower Extremity Assessment RLE ROM/Strength/Tone: WFL for tasks assessed RLE Sensation: Deficits RLE Sensation Deficits: numbness in foot RLE Coordination: WFL - gross motor Left Lower Extremity Assessment LLE ROM/Strength/Tone: WFL for tasks assessed LLE Sensation: Deficits LLE Sensation Deficits: numbness foot and lower leg LLE Coordination: WFL - gross motor   Balance    End of Session PT - End of Session Equipment Utilized During Treatment: Gait belt;Back brace Activity Tolerance: Patient limited by fatigue Patient left: in chair;with call bell/phone within reach;with family/visitor present Nurse Communication: Mobility status  GP     [name] 03/31/2012, 1:07 PM  Pager [phone]

## 2012-03-31 NOTE — Progress Notes (Signed)
Postop day 1. Patient complains of back pain. No lower extremity pain.Marland Kitchen still requiring her PCA for pain relief.  She is afebrile. Vitals are stable. Urine output good. Awake and alert oriented and appropriate. Motor and sensory function intact. Dressing dry.  Progressing well following decompression and fusion surgery. Work towards mobilization. Continue PCA for now.

## 2012-03-31 NOTE — Op Note (Signed)
[name], [name]            ACCOUNT NO.:  [id number]  MEDICAL RECORD NO.:  [id number]  LOCATION:  4N13C                        FACILITY:  [facility]  PHYSICIAN:  Hilda Lias, M.D.   DATE OF BIRTH:  [birth year]  DATE OF PROCEDURE:  03/30/2012 DATE OF DISCHARGE:                              OPERATIVE REPORT   PREOPERATIVE DIAGNOSIS:  Lumbar stenosis.  Neurogenic claudication, L3 to L5-S1 with a grade 1, grade 2 spondylolisthesis, L4-5.  POSTOPERATIVE DIAGNOSIS:  Lumbar stenosis.  Neurogenic claudication, L3 to L5-S1 with a grade 1, grade 2 spondylolisthesis, L4-5.  PROCEDURE:  Bilateral L3, L4, L5 laminectomy, foraminotomy, bilateral L4 facetectomy, bilateral L4-L5 diskectomy, beyond what normally we do to be able to introduce the cages.  Total L4-L5 diskectomy, insertion of 2 cages.  Pedicle screws L4-L5.  Posterolateral arthrodesis with autograft and morselized bone, Cell Saver, C-arm.  SURGEON:  Hilda Lias, MD  ASSISTANT:  Stefani Dama, M.D.  CLINICAL HISTORY:  [name] is a 62 year old female complaining of back pain with radiation to both legs.  The patient gets worse when she walks associated with weakness of both legs.  She has failed with conservative treatment.  X-ray shows stenosis from L3 to L5-S1 with grade 1, grade 2 spondylolisthesis at L4-5.  Surgery was advised in view of no improvement.  DESCRIPTION OF PROCEDURE:  The patient was taken to the OR, and after intubation, she was positioned in a prone manner.  The back was cleaned with DuraPrep.  Drapes were applied.  Midline incision was made from L2- 3 down to L4-5 and muscles were retracted laterally.  X-rays were taken and showed that indeed we were right at the level of L4-5.  From then on, we removed the spinous process of L3, L4, L5, with the laminectomy of L3, L4, and L5.  At the level of L4-5, we proceeded with facetectomy of L4.  The facets were loose, the left was worse than the right  one. Then, we entered the disk space.  Total gross diskectomy at L4-5 medial and laterally beyond what normally we do to be able to introduce the cage.  The endplates were removed.  Then 2 cages of 22 in length expandable were inserted.  The expansion was carried all the way up to 15.  Then, the rest of the bone graft was introduced at the level of the disk space.  From then on, using the C-arm, first in AP view and then laterally, we found the pedicle of L4 and L5.  Then, we introduced 4 screws of 6.5 x 45 and 50.  Prior to introducing the screws, we probed the hole just to be sure that we were surrounded by bone.  The pedicles were held in place using a rod and caps.  Then, we removed the periosteum of the lateral aspect of the L4-5 and a mix of morselized bone as well as autograft was used for arthrodesis.  Hemostasis was accomplished and the wound was closed in multiple layers with Vicryl and Steri-Strips.          ______________________________ Hilda Lias, M.D.     EB/MEDQ  D:  03/30/2012  T:  03/31/2012  Job:  [id number]

## 2012-03-31 NOTE — Progress Notes (Signed)
Orthopedic Tech Progress Note Patient Details:  PRETTY DILEO 03-Apr-1951 [id number]  Patient ID: Adelene Amas, female   DOB: 1950/11/07, 61 y.o.   MRN: [id number] Called in bio-tech brace order @9 :30;brace order completed by Storm Frisk, Laray Rivkin 03/31/2012, 11:24 AM

## 2012-03-31 NOTE — Progress Notes (Signed)
During shift assessment, pt's dressing on the incision site was soaked with moderate amount of blood. Dressing removed and seen blood draining from the incision site. Pressure dressing applied with the use of abdominal pad and secured with surgical tape. Pt has no discomfort. @0300am  dressing still intact with minimal stain of blood. Will continue to monitor.

## 2012-04-01 LAB — GLUCOSE, CAPILLARY: Glucose-Capillary: 100 mg/dL — ABNORMAL HIGH (ref 70–99)

## 2012-04-01 NOTE — Progress Notes (Signed)
Occupational Therapy Evaluation Patient Details Name: Audrey [name] MRN: [id number] DOB: 06/15/1951 Today's Date: 04/01/2012 Time: 1005-1030 OT Time Calculation (min): 25 min  OT Assessment / Plan / Recommendation Clinical Impression  Pt L3-5 laminectomies and L4-5 fusion thus affecting PLOF. Will benefit from acute OT services to address below problem list in prep for d/c home.  Pt will have 24/7 assist for first week.     OT Assessment  Patient needs continued OT Services    Follow Up Recommendations  Home health OT;Supervision/Assistance - 24 hour    Barriers to Discharge      Equipment Recommendations  3 in 1 bedside comode;Tub/shower bench    Recommendations for Other Services    Frequency  Min 2X/week    Precautions / Restrictions Precautions Precautions: Back Precaution Booklet Issued: Yes (comment) Precaution Comments: Educated pt on 3/3 back precautions Required Braces or Orthoses: Spinal Brace Spinal Brace: Lumbar corset;Applied in sitting position   Pertinent Vitals/Pain See vitals    ADL  Lower Body Bathing: Simulated;Maximal assistance Where Assessed - Lower Body Bathing: Supported sit to stand Lower Body Dressing: Simulated;Maximal assistance Where Assessed - Lower Body Dressing: Supported sit to stand Toilet Transfer: Mining engineer Method: Sit to Barista:  (chair) Equipment Used: Rolling walker;Back brace Transfers/Ambulation Related to ADLs: Supervision with RW during ambulation. Requires increased time due to pain. ADL Comments: Pt requires increased time for all ADL tasks and mobility due to pain.      OT Diagnosis: Generalized weakness;Acute pain  OT Problem List: Decreased strength;Decreased activity tolerance;Impaired balance (sitting and/or standing);Decreased knowledge of use of DME or AE;Decreased knowledge of precautions;Pain OT Treatment Interventions: Self-care/ADL training;DME and/or  AE instruction;Therapeutic activities;Patient/family education;Balance training   OT Goals Acute Rehab OT Goals OT Goal Formulation: With patient Time For Goal Achievement: 04/08/12 Potential to Achieve Goals: Good ADL Goals Pt Will Perform Grooming: with modified independence;Standing at sink ADL Goal: Grooming - Progress: Goal set today Pt Will Perform Lower Body Bathing: with modified independence;Sit to stand from chair;Sit to stand from bed;with adaptive equipment ADL Goal: Lower Body Bathing - Progress: Goal set today Pt Will Perform Lower Body Dressing: with modified independence;Sit to stand from chair;Sit to stand from bed;with adaptive equipment ADL Goal: Lower Body Dressing - Progress: Goal set today Pt Will Transfer to Toilet: with modified independence;Ambulation;with DME;Comfort height toilet;Maintaining back safety precautions ADL Goal: Toilet Transfer - Progress: Goal set today Pt Will Perform Toileting - Clothing Manipulation: with modified independence;Sitting on 3-in-1 or toilet;Standing;with adaptive equipment ADL Goal: Toileting - Clothing Manipulation - Progress: Goal set today Pt Will Perform Toileting - Hygiene: with modified independence;Standing at 3-in-1/toilet;Sit to stand from 3-in-1/toilet ADL Goal: Toileting - Hygiene - Progress: Goal set today Pt Will Perform Tub/Shower Transfer: Tub transfer;Ambulation;with DME;Transfer tub bench;with modified independence;Maintaining back safety precautions ADL Goal: Tub/Shower Transfer - Progress: Goal set today Miscellaneous OT Goals Miscellaneous OT Goal #1: Pt will perform bed mobility with mod I in prep for EOB ADLs. OT Goal: Miscellaneous Goal #1 - Progress: Goal set today Miscellaneous OT Goal #2: Pt will don/doff back brace independently sitting EOB. OT Goal: Miscellaneous Goal #2 - Progress: Goal set today  Visit Information  Last OT Received On: 04/01/12 Assistance Needed: +1    Subjective Data      Prior  Functioning  Vision/Perception  Home Living Lives With: Daughter;Other (Comment) (grandaughter) Available Help at Discharge: Available 24 hours/day;Family (for first week) Type of Home: House Home Access: Stairs  to enter Entrance Stairs-Number of Steps: 5 Entrance Stairs-Rails: Right;Left Home Layout: One level Bathroom Shower/Tub: Forensic scientist: Standard Bathroom Accessibility: Yes How Accessible: Accessible via walker Home Adaptive Equipment: None Prior Function Level of Independence: Independent Able to Take Stairs?: Yes Driving: Yes Vocation: Retired Musician: No difficulties      Cognition  Overall Cognitive Status: Appears within functional limits for tasks assessed/performed Arousal/Alertness: Awake/alert Orientation Level: Oriented X4 / Intact Behavior During Session: WFL for tasks performed    Extremity/Trunk Assessment Right Upper Extremity Assessment RUE ROM/Strength/Tone: Within functional levels Left Upper Extremity Assessment LUE ROM/Strength/Tone: Within functional levels   Mobility  Shoulder Instructions  Bed Mobility Bed Mobility: Sit to Sidelying Right;Rolling Right;Right Sidelying to Sit Right Sidelying to Sit: 4: Min assist;HOB flat Sitting - Scoot to [place] of Bed: 4: Min guard Sit to Sidelying Right: 3: Mod assist;HOB flat Details for Bed Mobility Assistance: Verbalized and demonstrated log roll technique for pt.  Assist to LEs for support into bed. Pt requires increased time for scooting hips EOB. Transfers Transfers: Sit to Stand;Stand to Sit Sit to Stand: From bed;From chair/3-in-1;With armrests;With upper extremity assist;4: Min assist;From elevated surface Stand to Sit: 4: Min guard;To chair/3-in-1;To bed;With upper extremity assist;With armrests;To elevated surface Details for Transfer Assistance: Elevated bed to simulate pt's bed at home. Min assist to obtain full upright posture and for steadying.  VC for safe hand placement.       Exercise     Balance     End of Session OT - End of Session Equipment Utilized During Treatment: Gait belt;Back brace Activity Tolerance: Patient limited by pain Patient left: in chair;with call bell/phone within reach;with family/visitor present Nurse Communication: Mobility status  GO    04/01/2012 Cipriano Mile OTR/L Pager [phone] Office [phone]  Cipriano Mile 04/01/2012, 10:47 AM

## 2012-04-01 NOTE — Progress Notes (Signed)
Physical Therapy Treatment Patient Details Name: Audrey [name] MRN: [id number] DOB: [birth year] Today's Date: 04/01/2012 Time: 1000-1030 PT Time Calculation (min): 30 min  PT Assessment / Plan / Recommendation Comments on Treatment Session  Making solid progress today with incr amb distance and practicing bed mobility; Plans to have prn assist at home furst week    Follow Up Recommendations  Home health PT;Supervision/Assistance - 24 hour    Barriers to Discharge        Equipment Recommendations  3 in 1 bedside comode;Tub/shower bench;Rolling walker with 5" wheels    Recommendations for Other Services OT consult  Frequency Min 5X/week   Plan Discharge plan remains appropriate    Precautions / Restrictions Precautions Precautions: Back Precaution Booklet Issued: Yes (comment) Precaution Comments: Educated pt on 3/3 back precautions Required Braces or Orthoses: Spinal Brace Spinal Brace: Lumbar corset;Applied in sitting position   Pertinent Vitals/Pain 5/10 back pain with moving; repositioned in chair    Mobility  Bed Mobility Bed Mobility: Right Sidelying to Sit;Sit to Sidelying Right Right Sidelying to Sit: 4: Min guard (without physical contact) Sitting - Scoot to Edge of Bed: 4: Min guard Sit to Sidelying Right: 3: Mod assist;HOB flat Details for Bed Mobility Assistance: Verbalized and demonstrated log roll technique for pt.  Assist to LEs for support into bed. Pt requires increased time for scooting hips EOB. Transfers Transfers: Sit to Stand;Stand to Sit Sit to Stand: 4: Min guard;From bed;From chair/3-in-1 (without physical contact) Stand to Sit: 4: Min guard;To chair/3-in-1;To bed;With upper extremity assist;With armrests;To elevated surface Details for Transfer Assistance: Elevated bed to simulate pt's bed at home. Min assist to obtain full upright posture and for steadying. VC for safe hand placement. Ambulation/Gait Ambulation/Gait Assistance: 4: Min  guard Ambulation Distance (Feet): 35 Feet Assistive device: Rolling walker Ambulation/Gait Assistance Details: Cues for posture and to fully extend hips for fully upright standing; Slow pace, but good progress Gait Pattern: Decreased stride length    Exercises     PT Diagnosis:    PT Problem List:   PT Treatment Interventions:     PT Goals Acute Rehab PT Goals Time For Goal Achievement: 04/07/12 Potential to Achieve Goals: Good Pt will go Supine/Side to Sit: with supervision;with HOB 0 degrees PT Goal: Supine/Side to Sit - Progress: Progressing toward goal Pt will go Sit to Supine/Side: with supervision;with HOB 0 degrees PT Goal: Sit to Supine/Side - Progress: Progressing toward goal Pt will go Sit to Stand: with supervision;with upper extremity assist PT Goal: Sit to Stand - Progress: Progressing toward goal Pt will go Stand to Sit: with supervision;with upper extremity assist PT Goal: Stand to Sit - Progress: Progressing toward goal Pt will Ambulate: 51 - 150 feet;with supervision;with least restrictive assistive device PT Goal: Ambulate - Progress: Progressing toward goal Additional Goals Additional Goal #1: Pt will be able to verbalize and adhere to back precautions while mobilizing PT Goal: Additional Goal #1 - Progress: Progressing toward goal  Visit Information  Last PT Received On: 04/01/12 Assistance Needed: +1    Subjective Data  Subjective: Agreeable to amb Patient Stated Goal: get back to her walking routine and lose some weight   Cognition  Overall Cognitive Status: Appears within functional limits for tasks assessed/performed Arousal/Alertness: Awake/alert Orientation Level: Oriented X4 / Intact Behavior During Session: [facility] for tasks performed    Balance     End of Session PT - End of Session Equipment Utilized During Treatment: Gait belt;Back brace Activity Tolerance: Patient tolerated  treatment well Patient left: in chair;with call bell/phone within  reach;with family/visitor present Nurse Communication: Mobility status   GP     Van Clines [facility] [place], [place] [phone]  04/01/2012, 1:37 PM

## 2012-04-01 NOTE — Progress Notes (Signed)
Subjective: Patient reports Stable postoperatively still on PCA with Foley catheter  Objective: Vital signs in last 24 hours: Temp:  [98 F (36.7 C)-98.4 F (36.9 C)] 98 F (36.7 C) (09/22 0944) Pulse Rate:  [81-89] 81  (09/22 0944) Resp:  [18] 18  (09/22 0944) BP: (109-122)/(50-66) 122/53 mmHg (09/22 0944) SpO2:  [96 %-100 %] 96 % (09/22 0944)  Intake/Output from previous day:   Intake/Output this shift: Total I/O In: 360 [P.O.:360] Out: 1000 [Urine:1000]  Incision clean dry ambulating motor function intact in lower extremities  Lab Results: No results found for this basename: WBC:2,HGB:2,HCT:2,PLT:2 in the last 72 hours BMET No results found for this basename: NA:2,K:2,CL:2,CO2:2,GLUCOSE:2,BUN:2,CREATININE:2,CALCIUM:2 in the last 72 hours  Studies/Results: Dg Lumbar Spine 2-3 Views  03/30/2012  *RADIOLOGY REPORT*  Clinical Data: L4-5 PLIF.  OPERATIVE LUMBAR SPINE - 2-3 VIEW  Comparison:  Intraoperative localizer lumbar spine images earlier same date.  Lumbar spine x-rays 09/05/2011 [facility].  Findings: 2 spot films from the C-arm fluoroscopic device, AP and lateral views the lower lumbar spine, were submitted for interpretation post-operatively, demonstrating bilateral pedicle screws at L4 and L5 and interbody fusion plugs at L4-5.  IMPRESSION: Images obtained during L4-5 PLIF.   Original Report Authenticated By: Arnell Sieving, M.D.    Dg C-arm 1-60 Min  03/30/2012  *RADIOLOGY REPORT*  Clinical Data: L4-5 PLIF.  OPERATIVE LUMBAR SPINE - 2-3 VIEW  Comparison:  Intraoperative localizer lumbar spine images earlier same date.  Lumbar spine x-rays 09/05/2011 [facility].  Findings: 2 spot films from the C-arm fluoroscopic device, AP and lateral views the lower lumbar spine, were submitted for interpretation post-operatively, demonstrating bilateral pedicle screws at L4 and L5 and interbody fusion plugs at L4-5.  IMPRESSION: Images obtained during L4-5 PLIF.    Original Report Authenticated By: Arnell Sieving, M.D.     Assessment/Plan: Stable postop day 2  LOS: 2 days  Discontinue PCA and Foley catheter   Audrey [name] J 04/01/2012, 10:51 AM

## 2012-04-01 NOTE — Progress Notes (Signed)
CSW consult for SNF.  PT recommendation for home with [facility] and 24 hour supervision vs. no PT f/u pending progress noted. Also noted pt lives with family who is able to provide 24 hour supervision at d/c. CSW signing off as no other CSW needs identified at this time. Please re-consult if SNF needed.  Dellie Burns, MSW, LCSWA [phone] (Weekends 8:00am-4:30pm)

## 2012-04-02 DIAGNOSIS — M4716 Other spondylosis with myelopathy, lumbar region: Secondary | ICD-10-CM

## 2012-04-02 LAB — GLUCOSE, CAPILLARY
Glucose-Capillary: 114 mg/dL — ABNORMAL HIGH (ref 70–99)
Glucose-Capillary: 139 mg/dL — ABNORMAL HIGH (ref 70–99)

## 2012-04-02 MED ORDER — POLYETHYLENE GLYCOL 3350 17 G PO PACK
17.0000 g | PACK | Freq: Every day | ORAL | Status: DC
  Administered 2012-04-02 – 2012-04-04 (×3): 17 g via ORAL
  Filled 2012-04-02 (×3): qty 1

## 2012-04-02 NOTE — Care Management Note (Signed)
    Page 1 of 2   04/05/2012     10:44:23 AM   CARE MANAGEMENT NOTE 04/05/2012  Patient:  Audrey [name], Audrey [name]   Account Number:  [id number]  Date Initiated:  04/02/2012  Documentation initiated by:  Onnie Boer  Subjective/Objective Assessment:   PT WAS ADMITTED FOR BACK SURGERY     Action/Plan:   PROGRESSION OF CARE AND DISCHARGE PLANNING   Anticipated DC Date:  04/04/2012   Anticipated DC Plan:  HOME/SELF CARE      DC Planning Services  CM consult      Choice offered to / List presented to:  C-1 Patient   DME arranged  3-N-1  Levan Hurst      DME agency  [facility]     HH arranged  HH-2 PT  HH-3 OT      [facility] agency  [facility]   Status of service:  Completed, signed off Medicare Important Message given?   (If response is "NO", the following Medicare IM given date fields will be blank) Date Medicare IM given:   Date Additional Medicare IM given:    Discharge Disposition:  HOME W HOME HEALTH SERVICES  Per UR Regulation:  Reviewed for med. necessity/level of care/duration of stay  If discussed at Long Length of Stay Meetings, dates discussed:    Comments:  04/02/12 Onnie Boer, RN, BSN 1124 PT WAS ADMITTED FOR A FUSION.  PTA PT WAS AT HOME WITH SELF CARE AND HER FAMILY.  WILL F/U ON DC NEEDS AND RECOMMENDATIONS.

## 2012-04-02 NOTE — Progress Notes (Signed)
Physical Therapy Treatment Patient Details Name: Audrey [name] MRN: [id number] DOB: 01/10/1951 Today's Date: 04/02/2012 Time: 4098-1191 PT Time Calculation (min): 23 min  PT Assessment / Plan / Recommendation Comments on Treatment Session  Pt making slow, steady progress. Primarily limited by leg>back pain. Pt has 4-5 steps to enter home and will need stair training 9/24.     Follow Up Recommendations  Home health PT;Supervision/Assistance - 24 hour    Barriers to Discharge        Equipment Recommendations  3 in 1 bedside comode;Tub/shower bench;Rolling walker with 5" wheels    Recommendations for Other Services    Frequency Min 5X/week   Plan Discharge plan remains appropriate;Frequency remains appropriate    Precautions / Restrictions Precautions Precautions: Back Precaution Comments: Pt able to verbalize 2/3 prior to session and 3/3 at end of session Required Braces or Orthoses: Spinal Brace Spinal Brace: Lumbar corset;Applied in sitting position   Pertinent Vitals/Pain 5/10 RLE pain > back pain    Mobility  Bed Mobility Bed Mobility: Not assessed Details for Bed Mobility Assistance: Pt sitting on EOB after bath on arrival Transfers Transfers: Sit to Stand;Stand to Sit Sit to Stand: 4: Min assist;With upper extremity assist;From bed Stand to Sit: 4: Min guard;With upper extremity assist;With armrests;To chair/3-in-1 Details for Transfer Assistance: vc for safe hand placement/use of RW; pt with difficulty transitioning hands up to RW with RW beginning to tip to side and required PT to stabilize RW Ambulation/Gait Ambulation/Gait Assistance: 4: Min guard Ambulation Distance (Feet): 64 Feet Assistive device: Rolling walker Ambulation/Gait Assistance Details: vc for upright posture (pt tends to flex at hips and ? lumbar region) ; pt reports RLE pain is worse than back pain and educated on step-to pattern with RLE leading Gait Pattern: Step-to pattern;Decreased stride  length;Trunk flexed Gait velocity: significantly decr Stairs: No    Exercises     PT Diagnosis:    PT Problem List:   PT Treatment Interventions:     PT Goals Acute Rehab PT Goals Pt will go Sit to Stand: with supervision;with upper extremity assist PT Goal: Sit to Stand - Progress: Progressing toward goal Pt will go Stand to Sit: with supervision;with upper extremity assist PT Goal: Stand to Sit - Progress: Progressing toward goal Pt will Ambulate: 51 - 150 feet;with supervision;with least restrictive assistive device PT Goal: Ambulate - Progress: Progressing toward goal Pt will Go Up / Down Stairs: 3-5 stairs;with min assist;with rail(s) PT Goal: Up/Down Stairs - Progress: Goal set today Additional Goals Additional Goal #1: Pt will be able to verbalize and adhere to back precautions while mobilizing PT Goal: Additional Goal #1 - Progress: Progressing toward goal  Visit Information  Last PT Received On: 04/02/12 Assistance Needed: +1    Subjective Data  Subjective: Reports she sat up for several hours yesterday Patient Stated Goal: get back to her walking routine and lose some weight   Cognition  Overall Cognitive Status: Appears within functional limits for tasks assessed/performed    Balance     End of Session PT - End of Session Equipment Utilized During Treatment: Gait belt;Back brace Activity Tolerance: Patient limited by fatigue;Patient limited by pain Patient left: in chair;with call bell/phone within reach;with family/visitor present Nurse Communication: Mobility status   GP     Tabor Denham 04/02/2012, 9:47 AM Pager [phone]

## 2012-04-02 NOTE — Consult Note (Signed)
Physical Medicine and Rehabilitation Consult Reason for Consult: Back pain with neurogenic claudication Referring Physician:  Dr. Jeral Fruit.   HPI: Audrey [name] is a 61 y.o. female HTN, DM, LBP with radiation to BLE with numbness due to lumbar stenosis L3-L5 with neurogenic claudication. Patient elected to undergo lumbar lam with decompression by Dr. Jeral Fruit on 03/31/12. Post op continues to be limited by pain/muscle spasms BLE with slow progress. MD recommending CIR.   Review of Systems  Eyes: Negative for blurred vision and double vision.  Respiratory: Negative for shortness of breath and wheezing.   Cardiovascular: Negative for chest pain and palpitations.  Gastrointestinal: Positive for constipation. Negative for heartburn.  Musculoskeletal: Positive for myalgias and back pain.  Neurological: Positive for weakness. Negative for headaches.   Past Medical History  Diagnosis Date  . PONV (postoperative nausea and vomiting)   . Hypertension   . Diabetes mellitus   . Headache   . Arthritis    Past Surgical History  Procedure Date  . Cervical fusion     2 yrs ago   at [facility]  . Carpal tunnel release     right hand  . Abdominal hysterectomy   . Breast biopsy     right   History reviewed. No pertinent family history.  Social History:  Widowed. Daughter live with her (has the week off to assist) she reports that she has quit smoking when she was 35.  She does not have any smokeless tobacco history on file. She reports that she does not drink alcohol or use illicit drugs. Independent without AD PTA.   Allergies: No Known Allergies  Medications Prior to Admission  Medication Sig Dispense Refill  . amLODipine-benazepril (LOTREL) 5-20 MG per capsule Take 1 capsule by mouth daily.      . Calcium-Magnesium-Zinc 167-83-8 MG TABS Take 1 tablet by mouth 2 (two) times daily.      . cetirizine (ZYRTEC) 10 MG tablet Take 10 mg by mouth daily as needed. allergies      . Cholecalciferol  (VITAMIN D-3) 5000 UNITS TABS Take 1 tablet by mouth daily.      . metFORMIN (GLUCOPHAGE) 1000 MG tablet Take 1,000 mg by mouth daily.      . metoprolol-hydrochlorothiazide (LOPRESSOR HCT) 50-25 MG per tablet Take 1 tablet by mouth 2 (two) times daily.      . Multiple Vitamin (MULTIVITAMIN WITH MINERALS) TABS Take 1 tablet by mouth daily.      . mometasone (NASONEX) 50 MCG/ACT nasal spray Place 2 sprays into the nose daily as needed. Allergies        Home: Home Living Lives With: Daughter;Other (Comment) (grandaughter) Available Help at Discharge: Available 24 hours/day;Family (for first week) Type of Home: House Home Access: Stairs to enter [employer] of Steps: 5 Entrance Stairs-Rails: Right;Left Home Layout: One level Bathroom Shower/Tub: Forensic scientist: Standard Bathroom Accessibility: Yes How Accessible: Accessible via walker Home Adaptive Equipment: None  Functional History: Prior Function Able to Take Stairs?: Yes Driving: Yes Vocation: Retired Functional Status:  Mobility: Bed Mobility Bed Mobility: Not assessed Rolling Right: 4: Min assist;With rail Right Sidelying to Sit: 4: Min guard (without physical contact) Sitting - Scoot to [place] of Bed: 4: Min guard Sit to Sidelying Right: 3: Mod assist;HOB flat Transfers Transfers: Sit to Stand;Stand to Sit Sit to Stand: 4: Min assist;With upper extremity assist;From bed Stand to Sit: 4: Min guard;With upper extremity assist;With armrests;To chair/3-in-1 Ambulation/Gait Ambulation/Gait Assistance: 4: Min guard Ambulation Distance (Feet):  64 Feet Assistive device: Rolling walker Ambulation/Gait Assistance Details: vc for upright posture (pt tends to flex at hips and ? lumbar region) ; pt reports RLE pain is worse than back pain and educated on step-to pattern with RLE leading Gait Pattern: Step-to pattern;Decreased stride length;Trunk flexed Gait velocity: significantly decr Stairs: No     ADL: ADL Lower Body Bathing: Simulated;Maximal assistance Where Assessed - Lower Body Bathing: Supported sit to stand Lower Body Dressing: Simulated;Maximal assistance Where Assessed - Lower Body Dressing: Supported sit to Pharmacist, hospital: Mining engineer Method: Sit to Barista:  (chair) Equipment Used: Rolling walker;Back brace Transfers/Ambulation Related to ADLs: Supervision with RW during ambulation. Requires increased time due to pain. ADL Comments: Pt requires increased time for all ADL tasks and mobility due to pain.    Cognition: Cognition Arousal/Alertness: Awake/alert Orientation Level: Oriented X4 Cognition Overall Cognitive Status: Appears within functional limits for tasks assessed/performed Arousal/Alertness: Awake/alert Orientation Level: Oriented X4 / Intact Behavior During Session: WFL for tasks performed Cognition - Other Comments: using PCA; easily drifts off to sleep  Blood pressure 119/62, pulse 63, temperature 98.3 F (36.8 C), temperature source Oral, resp. rate 20, height 5\' 2"  (1.575 m), weight 84.1 kg (185 lb 6.5 oz), SpO2 100.00%.  Physical Exam  Nursing note and vitals reviewed. Constitutional: She is oriented to person, place, and time. She appears well-developed and well-nourished.  HENT:  Head: Normocephalic and atraumatic.  Eyes: Pupils are equal, round, and reactive to light.  Neck: Normal range of motion. Neck supple.  Cardiovascular: Normal rate and regular rhythm.   Pulmonary/Chest: Effort normal and breath sounds normal.  Abdominal: Soft. Bowel sounds are normal.  Musculoskeletal: She exhibits no edema.  Neurological: She is alert and oriented to person, place, and time.  Reflex Scores:      Patellar reflexes are 2+ on the right side and 1+ on the left side.      Achilles reflexes are 1+ on the right side and 1+ on the left side.      UE strength 4/5. LE 2-3/5 prox to 4/5  distal. Decreased sensory over the posterior legs and foot.  Skin: Skin is warm and dry.  Psychiatric: Her behavior is normal. Thought content normal.    Results for orders placed during the hospital encounter of 03/30/12 (from the past 24 hour(s))  GLUCOSE, CAPILLARY     Status: Abnormal   Collection Time   04/01/12  4:44 PM      Component Value Range   Glucose-Capillary 106 (*) 70 - 99 mg/dL  GLUCOSE, CAPILLARY     Status: Abnormal   Collection Time   04/01/12  9:54 PM      Component Value Range   Glucose-Capillary 100 (*) 70 - 99 mg/dL  GLUCOSE, CAPILLARY     Status: Abnormal   Collection Time   04/02/12  6:53 AM      Component Value Range   Glucose-Capillary 114 (*) 70 - 99 mg/dL   Comment 1 Documented in Chart     Comment 2 Notify RN    GLUCOSE, CAPILLARY     Status: Abnormal   Collection Time   04/02/12 12:04 PM      Component Value Range   Glucose-Capillary 139 (*) 70 - 99 mg/dL   Comment 1 Documented in Chart     Comment 2 Notify RN     No results found.  Assessment/Plan: Diagnosis: lumbar stenosis with neurogenic claudication 1. Does the need  for close, 24 hr/day medical supervision in concert with the patient's rehab needs make it unreasonable for this patient to be served in a less intensive setting? No 2. Co-Morbidities requiring supervision/potential complications: see above 3. Due to bladder management, safety, disease management and medication administration, does the patient require 24 hr/day rehab nursing? No 4. Does the patient require coordinated care of a physician, rehab nurse, PT (1-2 hrs/day, 5 days/week) and OT (1-2 hrs/day, 5 days/week) to address physical and functional deficits in the context of the above medical diagnosis(es)? No Addressing deficits in the following areas: balance, endurance, locomotion, strength, transferring, bathing, dressing, feeding, grooming and toileting 5. Can the patient actively participate in an intensive therapy program of  at least 3 hrs of therapy per day at least 5 days per week? Potentially 6. The potential for patient to make measurable gains while on inpatient rehab is fair 7. Anticipated functional outcomes upon discharge from inpatient rehab are n/a with PT, n/a with OT, n/a with SLP. 8. Estimated rehab length of stay to reach the above functional goals is: n/a 9. Does the patient have adequate social supports to accommodate these discharge functional goals? Potentially 10. Anticipated D/C setting: Home 11. Anticipated post D/C treatments: HH therapy 12. Overall Rehab/Functional Prognosis: excellent  RECOMMENDATIONS: This patient's condition is appropriate for continued rehabilitative care in the following setting: [facility] Patient has agreed to participate in recommended program. Potentially Note that insurance prior authorization may be required for reimbursement for recommended care.  Comment:Pt already at a min assist level. Should be able to return home with home health and assistance of family. Could progress to an outpt program if needed.   9/23/2013Zach Riley Kill, MD

## 2012-04-02 NOTE — Progress Notes (Signed)
Patient ID: Audrey [name], female   DOB: Jun 28, 1951, 61 y.o.   MRN: [id number] C/o numbness both legs, no weakness. Wound dry

## 2012-04-03 LAB — GLUCOSE, CAPILLARY: Glucose-Capillary: 126 mg/dL — ABNORMAL HIGH (ref 70–99)

## 2012-04-03 MED FILL — Sodium Chloride IV Soln 0.9%: INTRAVENOUS | Qty: 1000 | Status: AC

## 2012-04-03 MED FILL — Sodium Chloride Irrigation Soln 0.9%: Qty: 3000 | Status: AC

## 2012-04-03 MED FILL — Heparin Sodium (Porcine) Inj 1000 Unit/ML: INTRAMUSCULAR | Qty: 30 | Status: AC

## 2012-04-03 NOTE — Progress Notes (Signed)
Pt CBG is 198 mg/dl @ 82:95 pm.  Talked to the patient and she states, "I ate some little sweet this morning which I usually don't, two cup of orange juice and a pan cake".    Will recheck the CBG later and let the doctor know if its still keeping high.

## 2012-04-03 NOTE — Progress Notes (Signed)
After seeinig two different opinion about D/C plan (home with HH vs SNIF), talked to the pt and daughter to confirm which one they prefer. She is happy to go home if she can get home health arranged. Will talk to case manager and do the arrangements.

## 2012-04-03 NOTE — Progress Notes (Signed)
Subjective: Patient reports PAIN R LEG  Objective: Vital signs in last 24 hours: Temp:  [98.2 F (36.8 C)-99.1 F (37.3 C)] 99.1 F (37.3 C) (09/24 0619) Pulse Rate:  [78-107] 86  (09/24 0619) Resp:  [20] 20  (09/23 2104) BP: (92-160)/(55-95) 160/65 mmHg (09/24 0950) SpO2:  [99 %-100 %] 100 % (09/24 0619)  Intake/Output from previous day:   Intake/Output this shift:    Wound dry some pain to righr leg  Lab Results: No results found for this basename: WBC:2,HGB:2,HCT:2,PLT:2 in the last 72 hours BMET No results found for this basename: NA:2,K:2,CL:2,CO2:2,GLUCOSE:2,BUN:2,CREATININE:2,CALCIUM:2 in the last 72 hours  Studies/Results: No results found.  Assessment/Plan: Seen by rehabilitatipn medicine. She does not need IP  LOS: 4 days  DC IN AM   Yuma Pacella M 04/03/2012, 10:40 AM

## 2012-04-03 NOTE — Progress Notes (Signed)
Re possible CIR admit:  Note therapy & CIR MD recommendations for d/c home w/ HH f/up.   Pt should be able to d/c home-call if situation changes.  [phone]

## 2012-04-03 NOTE — Progress Notes (Signed)
Physical Therapy Treatment Patient Details Name: Audrey [name] MRN: [id number] DOB: 1951/02/26 Today's Date: 04/03/2012 Time: 4098-1191 PT Time Calculation (min): 30 min  PT Assessment / Plan / Recommendation Comments on Treatment Session  Pts daughter present and educated on mobility and precautions.  Nice progression with mobility today.      Follow Up Recommendations  Home health PT;Supervision/Assistance - 24 hour    Barriers to Discharge        Equipment Recommendations  3 in 1 bedside comode;Tub/shower bench;Rolling walker with 5" wheels    Recommendations for Other Services    Frequency Min 5X/week   Plan Discharge plan remains appropriate;Frequency remains appropriate    Precautions / Restrictions Precautions Precautions: Back Required Braces or Orthoses: Spinal Brace Spinal Brace: Lumbar corset;Applied in sitting position Restrictions Weight Bearing Restrictions: No   Pertinent Vitals/Pain Premedicated.    Mobility  Bed Mobility Bed Mobility: Not assessed Transfers Transfers: Sit to Stand;Stand to Sit Sit to Stand: 5: Supervision;With upper extremity assist;With armrests;From bed;From chair/3-in-1 Stand to Sit: 5: Supervision;With upper extremity assist;With armrests;To chair/3-in-1 Details for Transfer Assistance: cues for hand placement.  Able to use LE's more today to assist with transition. Ambulation/Gait Ambulation/Gait Assistance: 5: Supervision Ambulation Distance (Feet): 140 Feet Assistive device: Rolling walker Ambulation/Gait Assistance Details: Slow and cautious gait.  Steady. Gait Pattern: Step-through pattern;Decreased stride length Stairs: Yes Stairs Assistance: 5: Supervision Stairs Assistance Details (indicate cue type and reason): cues for technique Stair Management Technique: One rail Right;Forwards Number of Stairs: 5         PT Goals Acute Rehab PT Goals PT Goal: Sit to Stand - Progress: Progressing toward goal PT Goal: Stand  to Sit - Progress: Progressing toward goal PT Goal: Ambulate - Progress: Progressing toward goal PT Goal: Up/Down Stairs - Progress: Met Additional Goals PT Goal: Additional Goal #1 - Progress: Progressing toward goal  Visit Information  Last PT Received On: 04/03/12 Assistance Needed: +1 PT/OT Co-Evaluation/Treatment: Yes    Subjective Data  Subjective: "I felt better today".   Cognition  Overall Cognitive Status: Appears within functional limits for tasks assessed/performed Arousal/Alertness: Awake/alert Orientation Level: Oriented X4 / Intact Behavior During Session: WFL for tasks performed        End of Session PT - End of Session Equipment Utilized During Treatment: Gait belt;Back brace Activity Tolerance: Patient tolerated treatment well Patient left: in chair;with call bell/phone within reach;Other (comment) (OT present) Nurse Communication: Mobility status    Newell Coral 04/03/2012, 1:09 PM  Newell Coral, PTA Acute Rehab [phone] (office)

## 2012-04-03 NOTE — Progress Notes (Signed)
Met w/ pt & daughter.  Told them of recommendations of HH f/up.  Pt said she is not comfortable w/ d/c straight home-would like to go SNF for f/up therapy.  Gave this info to pt's CM & SW.  [phone]

## 2012-04-03 NOTE — Progress Notes (Signed)
Occupational Therapy Treatment and Discharge Patient Details Name: ARVIS MIGUEZ MRN: [id number] DOB: 1951/04/06 Today's Date: 04/03/2012 Time: 4098-1191 OT Time Calculation (min): 78 min  OT Assessment / Plan / Recommendation Comments on Treatment Session This 61 yo making progress and all acute OT education completed. The rest of OT can be addressed by HHOT. Acute OT signing off.    Follow Up Recommendations  Home health OT;Supervision/Assistance - 24 hour       Equipment Recommendations  3 in 1 bedside comode;Tub/shower bench;Rolling walker with 5" wheels       Frequency Min 2X/week   Plan Discharge plan remains appropriate    Precautions / Restrictions Precautions Precautions: Back;Fall Precaution Comments: Pt able to verbalize 2/3 precautions (NO bending and arching), but needed a questioning cue for the 3rd one (No twisting) Spinal Brace: Lumbar corset;Applied in sitting position Restrictions Weight Bearing Restrictions: No       ADL  Lower Body Dressing: Performed;Supervision/safety;Set up Where Assessed - Lower Body Dressing: Unsupported sit to stand Toilet Transfer: Simulated;Supervision/safety Toilet Transfer Method: Sit to Barista:  (Bed around to recliner) Tub/Shower Transfer: Performed;Supervision/safety Tub/Shower Transfer Method: Science writer: Counsellor Used: Back brace;Rolling walker;Gait belt ADL Comments: Educated pt and daughter on use of AE and that it could be purchased in the gift shop.      OT Goals ADL Goals ADL Goal: Lower Body Dressing - Progress: Progressing toward goals ADL Goal: Toilet Transfer - Progress: Progressing toward goals ADL Goal: Tub/Shower Transfer - Progress: Progressing toward goals Miscellaneous OT Goals OT Goal: Miscellaneous Goal #2 - Progress: Progressing toward goals  Visit Information  Last OT Received On: 04/03/12 Assistance Needed:  +1 PT/OT Co-Evaluation/Treatment: Yes (partial for tasks in gym)    Subjective Data  Subjective: My pain is better now after the medicine Patient Stated Goal: Go home with help from daughter      Cognition  Overall Cognitive Status: Appears within functional limits for tasks assessed/performed Arousal/Alertness: Awake/alert Orientation Level: Appears intact for tasks assessed Behavior During Session: [facility] for tasks performed             End of Session OT - End of Session Equipment Utilized During Treatment: Gait belt;Back brace Activity Tolerance: Patient tolerated treatment well Patient left: in chair;with call bell/phone within reach;with family/visitor present (daughter)       Evette Georges [phone] 04/03/2012, 4:13 PM

## 2012-04-04 LAB — GLUCOSE, CAPILLARY
Glucose-Capillary: 162 mg/dL — ABNORMAL HIGH (ref 70–99)
Glucose-Capillary: 110 mg/dL — ABNORMAL HIGH (ref 70–99)

## 2012-04-04 MED ORDER — MAGNESIUM HYDROXIDE 400 MG/5ML PO SUSP
30.0000 mL | Freq: Every day | ORAL | Status: DC | PRN
  Administered 2012-04-04: 30 mL via ORAL
  Filled 2012-04-04: qty 30

## 2012-04-04 MED ORDER — FLEET ENEMA 7-19 GM/118ML RE ENEM: 1.0000 | ENEMA | Freq: Every day | RECTAL | Status: DC | PRN

## 2012-04-04 NOTE — Discharge Summary (Signed)
Physician Discharge Summary  Patient ID: Audrey [name] MRN: [id number] DOB/AGE: [birth year] 61 y.o.  Admit date: 03/30/2012 Discharge date: 04/04/2012  Admission Diagnoses:lumbar stenosis and spondylolisthesis  Discharge Diagnoses: same   Discharged Condition: stable.ambulating with help  Hospital Course lumbar decompression and fusion  Consults: rehabilitation medicine.  Significant Diagnostic Studies:myelogram  Treatments: surgery  Discharge Exam: Blood pressure 134/66, pulse 88, temperature 98.7 F (37.1 C), temperature source Oral, resp. rate 18, height 5\' 2"  (1.575 m), weight 84.1 kg (185 lb 6.5 oz), SpO2 96.00%. No weakness. No fever minimal lumbar drainage  Disposition: home on percocet and diazepam. hhh to see     Medication List     As of 04/04/2012 10:34 AM    ASK your doctor about these medications         amLODipine-benazepril 5-20 MG per capsule   Commonly known as: LOTREL   Take 1 capsule by mouth daily.      Calcium-Magnesium-Zinc 167-83-8 MG Tabs   Take 1 tablet by mouth 2 (two) times daily.      cetirizine 10 MG tablet   Commonly known as: ZYRTEC   Take 10 mg by mouth daily as needed. allergies      metFORMIN 1000 MG tablet   Commonly known as: GLUCOPHAGE   Take 1,000 mg by mouth daily.      metoprolol-hydrochlorothiazide 50-25 MG per tablet   Commonly known as: LOPRESSOR HCT   Take 1 tablet by mouth 2 (two) times daily.      mometasone 50 MCG/ACT nasal spray   Commonly known as: NASONEX   Place 2 sprays into the nose daily as needed. Allergies      multivitamin with minerals Tabs   Take 1 tablet by mouth daily.      Vitamin D-3 5000 UNITS Tabs   Take 1 tablet by mouth daily.         Signed: Karn Cassis 04/04/2012, 10:34 AM

## 2012-04-04 NOTE — Progress Notes (Signed)
04/04/12 1200  PT Visit Information  Last PT Received On 04/04/12  Assistance Needed +1  PT Time Calculation  PT Start Time 1047  PT Stop Time 1104  PT Time Calculation (min) 17 min  Precautions  Precautions Back;Fall  Precaution Comments Pt verbalizes 3/3 precautions  Required Braces or Orthoses Spinal Brace  Spinal Brace Lumbar corset;Applied in sitting position  Restrictions  Weight Bearing Restrictions No  Cognition  Overall Cognitive Status Appears within functional limits for tasks assessed/performed  Arousal/Alertness Awake/alert  Orientation Level Appears intact for tasks assessed  Behavior During Session [facility] for tasks performed  Bed Mobility  Bed Mobility Not assessed  Transfers  Transfers Sit to Stand;Stand to Sit  Sit to Stand 6: Modified independent (Device/Increase time);With upper extremity assist;With armrests;From toilet  Stand to Sit 6: Modified independent (Device/Increase time);With upper extremity assist;With armrests;To chair/3-in-1  Details for Transfer Assistance Pt getting up/down in room with modi I  Ambulation/Gait  Ambulation/Gait Assistance 5: Supervision  Ambulation Distance (Feet) 140 Feet  Assistive device Rolling walker  Ambulation/Gait Assistance Details Moving with increased speed today  Gait Pattern Step-through pattern;Decreased stride length  Stairs No  PT - End of Session  Equipment Utilized During Treatment Gait belt;Back brace  Activity Tolerance Patient tolerated treatment well  Patient left in chair;with call bell/phone within reach;with family/visitor present  PT - Assessment/Plan  Comments on Treatment Session Pt for d/c today by MD.  Pt and daughter comfortable with mobility and d/c home.  PT Plan Discharge plan remains appropriate;Frequency remains appropriate  PT Frequency Min 5X/week  Follow Up Recommendations Home health PT;Supervision/Assistance - 24 hour  Equipment Recommended 3 in 1 bedside comode;Tub/shower bench;Rolling  walker with 5" wheels  Acute Rehab PT Goals  PT Goal: Sit to Stand - Progress Met  PT Goal: Stand to Sit - Progress Met  PT Goal: Ambulate - Progress Met  Additional Goals  PT Goal: Additional Goal #1 - Progress Met  PT General Charges  $$ ACUTE PT VISIT 1 Procedure  PT Treatments  $Gait Training 8-22 mins    Newell Coral, [place] Acute Rehab [phone] (office)

## 2013-07-15 DIAGNOSIS — C541 Malignant neoplasm of endometrium: Secondary | ICD-10-CM | POA: Insufficient documentation

## 2013-08-01 DIAGNOSIS — M79606 Pain in leg, unspecified: Secondary | ICD-10-CM | POA: Insufficient documentation

## 2013-08-01 DIAGNOSIS — R600 Localized edema: Secondary | ICD-10-CM | POA: Insufficient documentation

## 2014-04-22 ENCOUNTER — Encounter: Payer: Self-pay | Admitting: *Deleted

## 2014-08-13 ENCOUNTER — Encounter (HOSPITAL_COMMUNITY): Payer: Self-pay

## 2014-08-14 ENCOUNTER — Other Ambulatory Visit (HOSPITAL_COMMUNITY): Payer: Self-pay | Admitting: Family Medicine

## 2014-08-14 DIAGNOSIS — R0989 Other specified symptoms and signs involving the circulatory and respiratory systems: Secondary | ICD-10-CM

## 2014-08-15 ENCOUNTER — Ambulatory Visit (HOSPITAL_COMMUNITY)
Admission: RE | Admit: 2014-08-15 | Discharge: 2014-08-15 | Disposition: A | Discharge status: Home / Self Care | Payer: BLUE CROSS/BLUE SHIELD | Source: Ambulatory Visit | Attending: Family Medicine | Admitting: Family Medicine

## 2014-08-15 DIAGNOSIS — I10 Essential (primary) hypertension: Secondary | ICD-10-CM | POA: Insufficient documentation

## 2014-08-15 DIAGNOSIS — E119 Type 2 diabetes mellitus without complications: Secondary | ICD-10-CM | POA: Diagnosis not present

## 2014-08-15 DIAGNOSIS — R0989 Other specified symptoms and signs involving the circulatory and respiratory systems: Secondary | ICD-10-CM | POA: Diagnosis present

## 2014-08-15 NOTE — Progress Notes (Signed)
VASCULAR LAB PRELIMINARY  ARTERIAL  ABI completed:    RIGHT    LEFT    PRESSURE WAVEFORM  PRESSURE WAVEFORM  BRACHIAL 197 triphasic BRACHIAL 201 triphasic  DP   DP    AT 193 triphasic AT 188 triphasic  PT 203 triphasic PT 191 triphasic  PER   PER    GREAT TOE  NA GREAT TOE  NA    RIGHT LEFT  ABI >1.0 0.95     Carmilla Granville, RVT 08/15/2014, 12:32 PM

## 2014-09-12 ENCOUNTER — Other Ambulatory Visit: Payer: Self-pay | Admitting: Neurosurgery

## 2014-09-12 DIAGNOSIS — M5136 Other intervertebral disc degeneration, lumbar region: Secondary | ICD-10-CM

## 2014-09-29 ENCOUNTER — Ambulatory Visit
Admission: RE | Admit: 2014-09-29 | Discharge: 2014-09-29 | Disposition: A | Discharge status: Home / Self Care | Payer: BLUE CROSS/BLUE SHIELD | Source: Ambulatory Visit | Attending: Neurosurgery | Admitting: Neurosurgery

## 2014-09-29 ENCOUNTER — Other Ambulatory Visit: Payer: Self-pay

## 2014-09-29 DIAGNOSIS — M5136 Other intervertebral disc degeneration, lumbar region: Secondary | ICD-10-CM

## 2014-09-29 MED ORDER — IOHEXOL 180 MG/ML  SOLN
15.0000 mL | Freq: Once | INTRAMUSCULAR | Status: AC | PRN
  Administered 2014-09-29: 15 mL via INTRATHECAL

## 2014-09-29 MED ORDER — DIAZEPAM 5 MG PO TABS: 10.0000 mg | ORAL_TABLET | Freq: Once | ORAL | Status: DC

## 2014-09-29 MED ORDER — ONDANSETRON HCL 4 MG/2ML IJ SOLN: 4.0000 mg | Freq: Four times a day (QID) | INTRAMUSCULAR | Status: DC | PRN

## 2014-09-29 MED ORDER — DIAZEPAM 5 MG PO TABS
10.0000 mg | ORAL_TABLET | Freq: Once | ORAL | Status: AC
  Administered 2014-09-29: 10 mg via ORAL

## 2014-09-29 NOTE — Discharge Instructions (Signed)

## 2014-10-22 ENCOUNTER — Other Ambulatory Visit: Payer: Self-pay | Admitting: Neurosurgery

## 2014-10-22 DIAGNOSIS — M5136 Other intervertebral disc degeneration, lumbar region: Secondary | ICD-10-CM

## 2014-11-04 ENCOUNTER — Ambulatory Visit
Admission: RE | Admit: 2014-11-04 | Discharge: 2014-11-04 | Disposition: A | Discharge status: Home / Self Care | Payer: BLUE CROSS/BLUE SHIELD | Source: Ambulatory Visit | Attending: Neurosurgery | Admitting: Neurosurgery

## 2014-11-04 DIAGNOSIS — M5136 Other intervertebral disc degeneration, lumbar region: Secondary | ICD-10-CM

## 2014-11-04 MED ORDER — GADOBENATE DIMEGLUMINE 529 MG/ML IV SOLN
18.0000 mL | Freq: Once | INTRAVENOUS | Status: AC | PRN
  Administered 2014-11-04: 18 mL via INTRAVENOUS

## 2014-11-17 ENCOUNTER — Other Ambulatory Visit: Payer: Self-pay | Admitting: Neurosurgery

## 2015-01-05 NOTE — Pre-Procedure Instructions (Signed)
Audrey [name]  01/05/2015      [id number] - [place], [place] [address] [place] [address] Phone: [phone] Fax: [phone]    Your procedure is scheduled on July 6th, Wednesday   Report to [facility] Admitting at 6:30 AM  Call this number if you have problems the morning of surgery:  [phone]   Remember:  Do not eat food or drink liquids after midnight Tuesday.  Take these medicines the morning of surgery with A SIP OF WATER : Lotrel, Metoprolol.  Please use Nasonex in the AM.             DO NOT take any diabetes medication the morning of surgery.   Do not wear jewelry, make-up or nail polish.  Do not wear lotions, powders, or perfumes.  You may NOT wear deodorant the day of surgery.  Do not shave underarms & legs 48 hours prior to surgery.     Do not bring valuables to the hospital.  [facility] is not responsible for any belongings or valuables.  Contacts, dentures or bridgework may not be worn into surgery.  Leave your suitcase in the car.  After surgery it may be brought to your room. For patients admitted to the hospital, discharge time will be determined by your treatment team.  Name and phone number of your driver:     Special instructions:  "Preparing for Surgery" instruction sheet.  Please read over the following fact sheets that you were given. Coughing and Deep Breathing, Surgical Site Infections, Staph & MRSA, Pain Management.

## 2015-01-06 ENCOUNTER — Encounter (HOSPITAL_COMMUNITY)
Admission: RE | Admit: 2015-01-06 | Discharge: 2015-01-06 | Disposition: A | Discharge status: Home / Self Care | Payer: BLUE CROSS/BLUE SHIELD | Source: Ambulatory Visit | Attending: Neurosurgery | Admitting: Neurosurgery

## 2015-01-06 ENCOUNTER — Encounter (HOSPITAL_COMMUNITY): Payer: Self-pay

## 2015-01-06 DIAGNOSIS — R9431 Abnormal electrocardiogram [ECG] [EKG]: Secondary | ICD-10-CM | POA: Insufficient documentation

## 2015-01-06 DIAGNOSIS — I10 Essential (primary) hypertension: Secondary | ICD-10-CM | POA: Insufficient documentation

## 2015-01-06 DIAGNOSIS — Z01812 Encounter for preprocedural laboratory examination: Secondary | ICD-10-CM | POA: Diagnosis present

## 2015-01-06 DIAGNOSIS — M5136 Other intervertebral disc degeneration, lumbar region: Secondary | ICD-10-CM | POA: Insufficient documentation

## 2015-01-06 HISTORY — DX: Cardiac murmur, unspecified: R01.1

## 2015-01-06 LAB — SURGICAL PCR SCREEN
MRSA, PCR: NEGATIVE
STAPHYLOCOCCUS AUREUS: NEGATIVE

## 2015-01-06 LAB — BASIC METABOLIC PANEL
Anion gap: 10 (ref 5–15)
BUN: 21 mg/dL — AB (ref 6–20)
CHLORIDE: 102 mmol/L (ref 101–111)
CO2: 26 mmol/L (ref 22–32)
Calcium: 9.7 mg/dL (ref 8.9–10.3)
Creatinine, Ser: 1.29 mg/dL — ABNORMAL HIGH (ref 0.44–1.00)
GFR calc Af Amer: 50 mL/min — ABNORMAL LOW (ref >60)
GFR calc non Af Amer: 43 mL/min — ABNORMAL LOW (ref >60)
Glucose, Bld: 145 mg/dL — ABNORMAL HIGH (ref 65–99)
Potassium: 4.1 mmol/L (ref 3.5–5.1)
Sodium: 138 mmol/L (ref 135–145)

## 2015-01-06 LAB — GLUCOSE, CAPILLARY: GLUCOSE-CAPILLARY: 138 mg/dL — AB (ref 65–99)

## 2015-01-06 LAB — CBC
HCT: 39.5 % (ref 36.0–46.0)
Hemoglobin: 13 g/dL (ref 12.0–15.0)
MCH: 28.5 pg (ref 26.0–34.0)
MCHC: 32.9 g/dL (ref 30.0–36.0)
MCV: 86.6 fL (ref 78.0–100.0)
PLATELETS: 238 10*3/uL (ref 150–400)
RBC: 4.56 MIL/uL (ref 3.87–5.11)
RDW: 13.7 % (ref 11.5–15.5)
WBC: 5.1 10*3/uL (ref 4.0–10.5)

## 2015-01-06 NOTE — Progress Notes (Addendum)
Patient was told she had a some point, she had a murmur...had ?echo she said--not sure where.  Will call her PCP Dr. B [name] from [place] [phone].  ( I have called office and they will send copies).   She denies any chest pains or problems.  She is no longer taking the metformin, and when asked who stopped it, her answer was she and her MD.   "my sugars were always normal". She did have some vascular studies done on her 'ankles, for swelling" but she does take Lotensin.  Med list updated.

## 2015-01-07 LAB — HEMOGLOBIN A1C
Hgb A1c MFr Bld: 7.2 % — ABNORMAL HIGH (ref 4.8–5.6)
Mean Plasma Glucose: 160 mg/dL

## 2015-01-13 NOTE — H&P (Signed)
Audrey [name] is an 64 y.o. female.   Chief Complaint: lower back pain CZY:SAYTKZS who underwent lower lumbar fusion 3 years ago with good relief of the pain but lately she has been complaining of lumbar pain with radiation to both legs specially while walking. sittting improves the pain. A upper lumbar fistula was ruled out.   Past Medical History  Diagnosis Date  . PONV (postoperative nausea and vomiting)   . Hypertension   . Headache(784.0)   . Arthritis   . Heart murmur     was told this earlier..did an echo 1-1.5 yrs ago.  ?? the cardio   . Diabetes mellitus     dx early mid 2000.  Type 2    Not taking metformin....    Past Surgical History  Procedure Laterality Date  . Cervical fusion      2 yrs ago   at [facility]  . Carpal tunnel release      right hand  . Abdominal hysterectomy    . Breast biopsy      right  . Cholecystectomy      No family history on file. Social History:  reports that she quit smoking about 34 years ago. She does not have any smokeless tobacco history on file. She reports that she does not drink alcohol or use illicit drugs.  Allergies: No Known Allergies  No prescriptions prior to admission    No results found for this or any previous visit (from the past 48 hour(s)). No results found.  Review of Systems  Constitutional: Negative.   HENT: Negative.   Eyes: Negative.   Respiratory: Negative.   Cardiovascular: Negative.   Gastrointestinal: Negative.   Genitourinary: Negative.   Musculoskeletal: Positive for back pain.  Skin: Negative.   Neurological: Positive for sensory change and focal weakness.  Endo/Heme/Allergies: Negative.   Psychiatric/Behavioral: Negative.     There were no vitals taken for this visit. Physical Exam hent, nl. Neck, nl. Cv, nl. Lungs,clear. Abdomen. Soft. Extremities, nl. NEURO sensory,nl. Femoral stretch maneuver is positive bilaterally.dtr ,nl. 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 She wants to go ahead with decompression and fusion at l5s1. She is aware of risks and benefits  Cleatus Gabriel M 01/13/2015, 11:29 AM

## 2015-01-14 ENCOUNTER — Inpatient Hospital Stay (HOSPITAL_COMMUNITY): Payer: BLUE CROSS/BLUE SHIELD | Admitting: Certified Registered Nurse Anesthetist

## 2015-01-14 ENCOUNTER — Inpatient Hospital Stay (HOSPITAL_COMMUNITY)
Admission: RE | Admit: 2015-01-14 | Discharge: 2015-01-23 | DRG: 460 | Disposition: A | Discharge status: Home Health | Payer: BLUE CROSS/BLUE SHIELD | Source: Ambulatory Visit | Attending: Neurosurgery | Admitting: Neurosurgery

## 2015-01-14 ENCOUNTER — Inpatient Hospital Stay (HOSPITAL_COMMUNITY): Payer: BLUE CROSS/BLUE SHIELD

## 2015-01-14 ENCOUNTER — Encounter (HOSPITAL_COMMUNITY): Payer: Self-pay | Admitting: *Deleted

## 2015-01-14 ENCOUNTER — Encounter (HOSPITAL_COMMUNITY)
Admission: RE | Disposition: A | Discharge status: Home Health | Payer: BLUE CROSS/BLUE SHIELD | Source: Ambulatory Visit | Attending: Neurosurgery

## 2015-01-14 DIAGNOSIS — R5383 Other fatigue: Secondary | ICD-10-CM | POA: Diagnosis not present

## 2015-01-14 DIAGNOSIS — Z6837 Body mass index (BMI) 37.0-37.9, adult: Secondary | ICD-10-CM | POA: Diagnosis not present

## 2015-01-14 DIAGNOSIS — E119 Type 2 diabetes mellitus without complications: Secondary | ICD-10-CM | POA: Diagnosis present

## 2015-01-14 DIAGNOSIS — M4807 Spinal stenosis, lumbosacral region: Secondary | ICD-10-CM | POA: Diagnosis present

## 2015-01-14 DIAGNOSIS — Z87891 Personal history of nicotine dependence: Secondary | ICD-10-CM

## 2015-01-14 DIAGNOSIS — K5900 Constipation, unspecified: Secondary | ICD-10-CM | POA: Diagnosis not present

## 2015-01-14 DIAGNOSIS — R410 Disorientation, unspecified: Secondary | ICD-10-CM | POA: Diagnosis not present

## 2015-01-14 DIAGNOSIS — I959 Hypotension, unspecified: Secondary | ICD-10-CM | POA: Diagnosis not present

## 2015-01-14 DIAGNOSIS — Z981 Arthrodesis status: Secondary | ICD-10-CM

## 2015-01-14 DIAGNOSIS — E876 Hypokalemia: Secondary | ICD-10-CM | POA: Diagnosis not present

## 2015-01-14 DIAGNOSIS — M1990 Unspecified osteoarthritis, unspecified site: Secondary | ICD-10-CM | POA: Diagnosis present

## 2015-01-14 DIAGNOSIS — M5116 Intervertebral disc disorders with radiculopathy, lumbar region: Secondary | ICD-10-CM | POA: Diagnosis present

## 2015-01-14 DIAGNOSIS — I10 Essential (primary) hypertension: Secondary | ICD-10-CM | POA: Diagnosis present

## 2015-01-14 DIAGNOSIS — R0902 Hypoxemia: Secondary | ICD-10-CM

## 2015-01-14 DIAGNOSIS — I129 Hypertensive chronic kidney disease with stage 1 through stage 4 chronic kidney disease, or unspecified chronic kidney disease: Secondary | ICD-10-CM | POA: Diagnosis present

## 2015-01-14 DIAGNOSIS — R064 Hyperventilation: Secondary | ICD-10-CM

## 2015-01-14 DIAGNOSIS — N189 Chronic kidney disease, unspecified: Secondary | ICD-10-CM | POA: Diagnosis present

## 2015-01-14 DIAGNOSIS — M5117 Intervertebral disc disorders with radiculopathy, lumbosacral region: Secondary | ICD-10-CM | POA: Diagnosis present

## 2015-01-14 DIAGNOSIS — M5136 Other intervertebral disc degeneration, lumbar region: Secondary | ICD-10-CM | POA: Diagnosis not present

## 2015-01-14 DIAGNOSIS — E222 Syndrome of inappropriate secretion of antidiuretic hormone: Secondary | ICD-10-CM | POA: Diagnosis not present

## 2015-01-14 DIAGNOSIS — M5126 Other intervertebral disc displacement, lumbar region: Secondary | ICD-10-CM

## 2015-01-14 DIAGNOSIS — Z419 Encounter for procedure for purposes other than remedying health state, unspecified: Secondary | ICD-10-CM

## 2015-01-14 DIAGNOSIS — R441 Visual hallucinations: Secondary | ICD-10-CM | POA: Diagnosis not present

## 2015-01-14 DIAGNOSIS — E871 Hypo-osmolality and hyponatremia: Secondary | ICD-10-CM | POA: Diagnosis not present

## 2015-01-14 DIAGNOSIS — M51369 Other intervertebral disc degeneration, lumbar region without mention of lumbar back pain or lower extremity pain: Secondary | ICD-10-CM | POA: Diagnosis present

## 2015-01-14 DIAGNOSIS — I160 Hypertensive urgency: Secondary | ICD-10-CM | POA: Diagnosis present

## 2015-01-14 LAB — GLUCOSE, CAPILLARY
GLUCOSE-CAPILLARY: 166 mg/dL — AB (ref 65–99)
Glucose-Capillary: 159 mg/dL — ABNORMAL HIGH (ref 65–99)
Glucose-Capillary: 195 mg/dL — ABNORMAL HIGH (ref 65–99)
Glucose-Capillary: 156 mg/dL — ABNORMAL HIGH (ref 65–99)

## 2015-01-14 LAB — TYPE AND SCREEN
ABO/RH(D): O POS
ANTIBODY SCREEN: NEGATIVE

## 2015-01-14 SURGERY — POSTERIOR LUMBAR FUSION 1 LEVEL
Anesthesia: General

## 2015-01-14 MED ORDER — MENTHOL 3 MG MT LOZG: 1.0000 | LOZENGE | OROMUCOSAL | Status: DC | PRN

## 2015-01-14 MED ORDER — LIDOCAINE HCL (CARDIAC) 20 MG/ML IV SOLN
INTRAVENOUS | Status: AC
  Filled 2015-01-14: qty 10

## 2015-01-14 MED ORDER — SENNOSIDES-DOCUSATE SODIUM 8.6-50 MG PO TABS
1.0000 | ORAL_TABLET | Freq: Every evening | ORAL | Status: DC | PRN
  Administered 2015-01-17 – 2015-01-22 (×4): 1 via ORAL
  Filled 2015-01-14 (×5): qty 1

## 2015-01-14 MED ORDER — PROPOFOL 10 MG/ML IV BOLUS
INTRAVENOUS | Status: DC | PRN
  Administered 2015-01-14: 200 mg via INTRAVENOUS

## 2015-01-14 MED ORDER — PROPOFOL 10 MG/ML IV BOLUS
INTRAVENOUS | Status: AC
  Filled 2015-01-14: qty 20

## 2015-01-14 MED ORDER — LACTATED RINGERS IV SOLN
INTRAVENOUS | Status: DC | PRN
  Administered 2015-01-14 (×3): via INTRAVENOUS

## 2015-01-14 MED ORDER — SODIUM CHLORIDE 0.9 % IV SOLN: 250.0000 mL | INTRAVENOUS | Status: DC

## 2015-01-14 MED ORDER — VANCOMYCIN HCL 1000 MG IV SOLR
INTRAVENOUS | Status: AC
  Filled 2015-01-14: qty 2000

## 2015-01-14 MED ORDER — VECURONIUM BROMIDE 10 MG IV SOLR
INTRAVENOUS | Status: AC
  Filled 2015-01-14: qty 10

## 2015-01-14 MED ORDER — HYDROCHLOROTHIAZIDE 12.5 MG PO CAPS
12.5000 mg | ORAL_CAPSULE | Freq: Every day | ORAL | Status: DC
  Administered 2015-01-14 – 2015-01-15 (×2): 12.5 mg via ORAL
  Filled 2015-01-14 (×2): qty 1

## 2015-01-14 MED ORDER — CEFAZOLIN SODIUM-DEXTROSE 2-3 GM-% IV SOLR
INTRAVENOUS | Status: AC
  Filled 2015-01-14: qty 50

## 2015-01-14 MED ORDER — PROMETHAZINE HCL 25 MG/ML IJ SOLN: 6.2500 mg | INTRAMUSCULAR | Status: DC | PRN

## 2015-01-14 MED ORDER — ROCURONIUM BROMIDE 100 MG/10ML IV SOLN
INTRAVENOUS | Status: DC | PRN
  Administered 2015-01-14: 50 mg via INTRAVENOUS

## 2015-01-14 MED ORDER — MORPHINE SULFATE 1 MG/ML IV SOLN
INTRAVENOUS | Status: DC
  Administered 2015-01-14: 21 mg via INTRAVENOUS
  Administered 2015-01-14 – 2015-01-15 (×2): via INTRAVENOUS
  Filled 2015-01-14 (×3): qty 25

## 2015-01-14 MED ORDER — PHENYLEPHRINE HCL 10 MG/ML IJ SOLN
INTRAMUSCULAR | Status: AC
  Filled 2015-01-14: qty 1

## 2015-01-14 MED ORDER — SODIUM CHLORIDE 0.9 % IV SOLN
INTRAVENOUS | Status: DC
  Administered 2015-01-16 – 2015-01-17 (×2): via INTRAVENOUS

## 2015-01-14 MED ORDER — METOPROLOL SUCCINATE ER 25 MG PO TB24
25.0000 mg | ORAL_TABLET | Freq: Every day | ORAL | Status: DC
Start: 2015-01-15 — End: 2015-01-16
  Administered 2015-01-15 – 2015-01-16 (×2): 25 mg via ORAL
  Filled 2015-01-14 (×4): qty 1

## 2015-01-14 MED ORDER — ACETAMINOPHEN 650 MG RE SUPP: 650.0000 mg | RECTAL | Status: DC | PRN

## 2015-01-14 MED ORDER — NEOSTIGMINE METHYLSULFATE 10 MG/10ML IV SOLN
INTRAVENOUS | Status: DC | PRN
  Administered 2015-01-14: 4 mg via INTRAVENOUS

## 2015-01-14 MED ORDER — DIPHENHYDRAMINE HCL 12.5 MG/5ML PO ELIX: 12.5000 mg | ORAL_SOLUTION | Freq: Four times a day (QID) | ORAL | Status: DC | PRN

## 2015-01-14 MED ORDER — ONDANSETRON HCL 4 MG/2ML IJ SOLN
4.0000 mg | Freq: Four times a day (QID) | INTRAMUSCULAR | Status: DC | PRN
  Filled 2015-01-14: qty 2

## 2015-01-14 MED ORDER — ONDANSETRON HCL 4 MG/2ML IJ SOLN
INTRAMUSCULAR | Status: DC | PRN
  Administered 2015-01-14: 4 mg via INTRAVENOUS

## 2015-01-14 MED ORDER — POTASSIUM GLUCONATE 595 MG PO CAPS: 1.0000 | ORAL_CAPSULE | Freq: Every day | ORAL | Status: DC

## 2015-01-14 MED ORDER — STERILE WATER FOR INJECTION IJ SOLN
INTRAMUSCULAR | Status: AC
  Filled 2015-01-14: qty 10

## 2015-01-14 MED ORDER — SODIUM CHLORIDE 0.9 % IJ SOLN: 9.0000 mL | INTRAMUSCULAR | Status: DC | PRN

## 2015-01-14 MED ORDER — VANCOMYCIN HCL 1000 MG IV SOLR
INTRAVENOUS | Status: DC | PRN
  Administered 2015-01-14: 2000 mg

## 2015-01-14 MED ORDER — EPHEDRINE SULFATE 50 MG/ML IJ SOLN
INTRAMUSCULAR | Status: AC
  Filled 2015-01-14: qty 1

## 2015-01-14 MED ORDER — PHENOL 1.4 % MT LIQD: 1.0000 | OROMUCOSAL | Status: DC | PRN

## 2015-01-14 MED ORDER — LABETALOL HCL 5 MG/ML IV SOLN
INTRAVENOUS | Status: AC
  Filled 2015-01-14: qty 4

## 2015-01-14 MED ORDER — MIDAZOLAM HCL 2 MG/2ML IJ SOLN: 0.5000 mg | Freq: Once | INTRAMUSCULAR | Status: DC | PRN

## 2015-01-14 MED ORDER — ACETAMINOPHEN 10 MG/ML IV SOLN
INTRAVENOUS | Status: AC
  Administered 2015-01-14: 1000 mg via INTRAVENOUS
  Filled 2015-01-14: qty 100

## 2015-01-14 MED ORDER — ACETAMINOPHEN 325 MG PO TABS
650.0000 mg | ORAL_TABLET | ORAL | Status: DC | PRN
  Administered 2015-01-14 – 2015-01-19 (×4): 650 mg via ORAL
  Filled 2015-01-14 (×4): qty 2

## 2015-01-14 MED ORDER — BIOTIN 2500 MCG PO CAPS: 2500.0000 ug | ORAL_CAPSULE | Freq: Every day | ORAL | Status: DC

## 2015-01-14 MED ORDER — SODIUM CHLORIDE 0.9 % IJ SOLN
3.0000 mL | INTRAMUSCULAR | Status: DC | PRN
  Administered 2015-01-17 (×2): 3 mL via INTRAVENOUS
  Filled 2015-01-14 (×2): qty 3

## 2015-01-14 MED ORDER — ARTIFICIAL TEARS OP OINT
TOPICAL_OINTMENT | OPHTHALMIC | Status: AC
Start: 2015-01-14 — End: 2015-01-14
  Filled 2015-01-14: qty 3.5

## 2015-01-14 MED ORDER — OXYCODONE-ACETAMINOPHEN 5-325 MG PO TABS: 2.0000 | ORAL_TABLET | ORAL | Status: DC | PRN

## 2015-01-14 MED ORDER — ARTIFICIAL TEARS OP OINT
TOPICAL_OINTMENT | OPHTHALMIC | Status: DC | PRN
  Administered 2015-01-14: 1 via OPHTHALMIC

## 2015-01-14 MED ORDER — DIAZEPAM 5 MG PO TABS: 5.0000 mg | ORAL_TABLET | Freq: Four times a day (QID) | ORAL | Status: DC | PRN

## 2015-01-14 MED ORDER — ROCURONIUM BROMIDE 50 MG/5ML IV SOLN
INTRAVENOUS | Status: AC
  Filled 2015-01-14: qty 1

## 2015-01-14 MED ORDER — 0.9 % SODIUM CHLORIDE (POUR BTL) OPTIME
TOPICAL | Status: DC | PRN
  Administered 2015-01-14: 1000 mL

## 2015-01-14 MED ORDER — HYDROMORPHONE HCL 1 MG/ML IJ SOLN: 0.2500 mg | INTRAMUSCULAR | Status: DC | PRN

## 2015-01-14 MED ORDER — NEOSTIGMINE METHYLSULFATE 10 MG/10ML IV SOLN
INTRAVENOUS | Status: AC
  Filled 2015-01-14: qty 1

## 2015-01-14 MED ORDER — NALOXONE HCL 0.4 MG/ML IJ SOLN: 0.4000 mg | INTRAMUSCULAR | Status: DC | PRN

## 2015-01-14 MED ORDER — FENTANYL CITRATE (PF) 250 MCG/5ML IJ SOLN
INTRAMUSCULAR | Status: AC
  Filled 2015-01-14: qty 5

## 2015-01-14 MED ORDER — CEFAZOLIN SODIUM 1-5 GM-% IV SOLN
1.0000 g | Freq: Three times a day (TID) | INTRAVENOUS | Status: AC
  Administered 2015-01-14 (×2): 1 g via INTRAVENOUS
  Filled 2015-01-14 (×2): qty 50

## 2015-01-14 MED ORDER — SODIUM CHLORIDE 0.9 % IJ SOLN
3.0000 mL | Freq: Two times a day (BID) | INTRAMUSCULAR | Status: DC
  Administered 2015-01-14 – 2015-01-19 (×8): 3 mL via INTRAVENOUS

## 2015-01-14 MED ORDER — LABETALOL HCL 5 MG/ML IV SOLN
INTRAVENOUS | Status: DC | PRN
  Administered 2015-01-14 (×4): 5 mg via INTRAVENOUS

## 2015-01-14 MED ORDER — BENAZEPRIL HCL 20 MG PO TABS
20.0000 mg | ORAL_TABLET | Freq: Every day | ORAL | Status: DC
  Administered 2015-01-14 – 2015-01-15 (×2): 20 mg via ORAL
  Filled 2015-01-14 (×2): qty 1

## 2015-01-14 MED ORDER — SCOPOLAMINE 1 MG/3DAYS TD PT72
MEDICATED_PATCH | TRANSDERMAL | Status: AC
  Administered 2015-01-14: 1 via TRANSDERMAL
  Filled 2015-01-14: qty 1

## 2015-01-14 MED ORDER — BUPIVACAINE LIPOSOME 1.3 % IJ SUSP
INTRAMUSCULAR | Status: DC | PRN
  Administered 2015-01-14: 20 mL

## 2015-01-14 MED ORDER — CEFAZOLIN SODIUM-DEXTROSE 2-3 GM-% IV SOLR
2.0000 g | INTRAVENOUS | Status: AC
  Administered 2015-01-14: 2 g via INTRAVENOUS

## 2015-01-14 MED ORDER — FENTANYL CITRATE (PF) 100 MCG/2ML IJ SOLN
INTRAMUSCULAR | Status: DC | PRN
  Administered 2015-01-14: 150 ug via INTRAVENOUS
  Administered 2015-01-14 (×5): 50 ug via INTRAVENOUS
  Administered 2015-01-14: 100 ug via INTRAVENOUS

## 2015-01-14 MED ORDER — DIPHENHYDRAMINE HCL 50 MG/ML IJ SOLN: 12.5000 mg | Freq: Four times a day (QID) | INTRAMUSCULAR | Status: DC | PRN

## 2015-01-14 MED ORDER — BENAZEPRIL-HYDROCHLOROTHIAZIDE 20-12.5 MG PO TABS: 2.0000 | ORAL_TABLET | Freq: Every day | ORAL | Status: DC

## 2015-01-14 MED ORDER — DEXAMETHASONE SODIUM PHOSPHATE 4 MG/ML IJ SOLN
INTRAMUSCULAR | Status: DC | PRN
  Administered 2015-01-14: 4 mg via INTRAVENOUS

## 2015-01-14 MED ORDER — LIDOCAINE HCL (CARDIAC) 20 MG/ML IV SOLN
INTRAVENOUS | Status: DC | PRN
Start: 2015-01-14 — End: 2015-01-14
  Administered 2015-01-14: 60 mg via INTRATRACHEAL
  Administered 2015-01-14: 20 mg via INTRAVENOUS

## 2015-01-14 MED ORDER — DIPHENHYDRAMINE HCL 50 MG/ML IJ SOLN
INTRAMUSCULAR | Status: DC | PRN
  Administered 2015-01-14: 12.5 mg via INTRAVENOUS

## 2015-01-14 MED ORDER — GLYCOPYRROLATE 0.2 MG/ML IJ SOLN
INTRAMUSCULAR | Status: AC
  Filled 2015-01-14: qty 1

## 2015-01-14 MED ORDER — ZOLPIDEM TARTRATE 5 MG PO TABS
5.0000 mg | ORAL_TABLET | Freq: Every evening | ORAL | Status: DC | PRN
  Administered 2015-01-17: 5 mg via ORAL
  Filled 2015-01-14: qty 1

## 2015-01-14 MED ORDER — ONDANSETRON HCL 4 MG/2ML IJ SOLN
4.0000 mg | INTRAMUSCULAR | Status: DC | PRN
  Administered 2015-01-15 – 2015-01-16 (×2): 4 mg via INTRAVENOUS
  Filled 2015-01-14: qty 2

## 2015-01-14 MED ORDER — SUCCINYLCHOLINE CHLORIDE 20 MG/ML IJ SOLN
INTRAMUSCULAR | Status: AC
  Filled 2015-01-14: qty 1

## 2015-01-14 MED ORDER — MIDAZOLAM HCL 2 MG/2ML IJ SOLN
INTRAMUSCULAR | Status: AC
  Filled 2015-01-14: qty 2

## 2015-01-14 MED ORDER — VECURONIUM BROMIDE 10 MG IV SOLR
INTRAVENOUS | Status: DC | PRN
  Administered 2015-01-14: 1 mg via INTRAVENOUS
  Administered 2015-01-14 (×2): 2 mg via INTRAVENOUS

## 2015-01-14 MED ORDER — MEPERIDINE HCL 25 MG/ML IJ SOLN: 6.2500 mg | INTRAMUSCULAR | Status: DC | PRN

## 2015-01-14 MED ORDER — GLYCOPYRROLATE 0.2 MG/ML IJ SOLN
INTRAMUSCULAR | Status: AC
  Filled 2015-01-14: qty 3

## 2015-01-14 MED ORDER — THROMBIN 20000 UNITS EX SOLR
CUTANEOUS | Status: DC | PRN
  Administered 2015-01-14: 09:00:00 via TOPICAL

## 2015-01-14 MED ORDER — BUPIVACAINE LIPOSOME 1.3 % IJ SUSP
20.0000 mL | INTRAMUSCULAR | Status: AC
  Filled 2015-01-14: qty 20

## 2015-01-14 MED ORDER — GLYCOPYRROLATE 0.2 MG/ML IJ SOLN
INTRAMUSCULAR | Status: DC | PRN
  Administered 2015-01-14: 0.6 mg via INTRAVENOUS

## 2015-01-14 MED ORDER — ONDANSETRON HCL 4 MG/2ML IJ SOLN
INTRAMUSCULAR | Status: AC
  Filled 2015-01-14: qty 2

## 2015-01-14 MED ORDER — MIDAZOLAM HCL 5 MG/5ML IJ SOLN
INTRAMUSCULAR | Status: DC | PRN
  Administered 2015-01-14: 2 mg via INTRAVENOUS

## 2015-01-14 MED ORDER — PHENYLEPHRINE HCL 10 MG/ML IJ SOLN
10.0000 mg | INTRAVENOUS | Status: DC | PRN
  Administered 2015-01-14: 30 ug/min via INTRAVENOUS

## 2015-01-14 SURGICAL SUPPLY — 76 items
APL SKNCLS STERI-STRIP NONHPOA (GAUZE/BANDAGES/DRESSINGS) ×1
BENZOIN TINCTURE PRP APPL 2/3 (GAUZE/BANDAGES/DRESSINGS) ×3 IMPLANT
BLADE CLIPPER SURG (BLADE) IMPLANT
BUR ACORN 6.0 (BURR) ×2 IMPLANT
BUR ACORN 6.0MM (BURR) ×1
BUR MATCHSTICK NEURO 3.0 LAGG (BURR) ×3 IMPLANT
CANISTER SUCT 3000ML PPV (MISCELLANEOUS) ×3 IMPLANT
CAP REVERE LOCKING (Cap) ×4 IMPLANT
CLAMP CONNECTOR 6.5-6.5MM (Clamp) ×4 IMPLANT
CLOSURE WOUND 1/2 X4 (GAUZE/BANDAGES/DRESSINGS) ×1
CONT SPEC 4OZ CLIKSEAL STRL BL (MISCELLANEOUS) ×3 IMPLANT
COVER BACK TABLE 60X90IN (DRAPES) ×3 IMPLANT
DRAPE C-ARM 42X72 X-RAY (DRAPES) ×6 IMPLANT
DRAPE LAPAROTOMY 100X72X124 (DRAPES) ×3 IMPLANT
DRAPE POUCH INSTRU U-SHP 10X18 (DRAPES) ×3 IMPLANT
DRSG PAD ABDOMINAL 8X10 ST (GAUZE/BANDAGES/DRESSINGS) ×2 IMPLANT
DURAPREP 26ML APPLICATOR (WOUND CARE) ×3 IMPLANT
ELECT BLADE 4.0 EZ CLEAN MEGAD (MISCELLANEOUS) ×3
ELECT REM PT RETURN 9FT ADLT (ELECTROSURGICAL) ×3
ELECTRODE BLDE 4.0 EZ CLN MEGD (MISCELLANEOUS) IMPLANT
ELECTRODE REM PT RTRN 9FT ADLT (ELECTROSURGICAL) ×1 IMPLANT
EVACUATOR 1/8 PVC DRAIN (DRAIN) IMPLANT
GAUZE SPONGE 4X4 12PLY STRL (GAUZE/BANDAGES/DRESSINGS) ×3 IMPLANT
GAUZE SPONGE 4X4 16PLY XRAY LF (GAUZE/BANDAGES/DRESSINGS) ×3 IMPLANT
GLOVE BIOGEL M 8.0 STRL (GLOVE) ×3 IMPLANT
GLOVE BIOGEL PI IND STRL 7.5 (GLOVE) IMPLANT
GLOVE BIOGEL PI IND STRL 8.5 (GLOVE) IMPLANT
GLOVE BIOGEL PI INDICATOR 7.5 (GLOVE) ×6
GLOVE BIOGEL PI INDICATOR 8.5 (GLOVE) ×6
GLOVE ECLIPSE 8.0 STRL XLNG CF (GLOVE) ×6 IMPLANT
GLOVE EXAM NITRILE LRG STRL (GLOVE) IMPLANT
GLOVE EXAM NITRILE MD LF STRL (GLOVE) IMPLANT
GLOVE EXAM NITRILE XL STR (GLOVE) IMPLANT
GLOVE EXAM NITRILE XS STR PU (GLOVE) IMPLANT
GOWN STRL REUS W/ TWL LRG LVL3 (GOWN DISPOSABLE) ×1 IMPLANT
GOWN STRL REUS W/ TWL XL LVL3 (GOWN DISPOSABLE) IMPLANT
GOWN STRL REUS W/TWL 2XL LVL3 (GOWN DISPOSABLE) IMPLANT
GOWN STRL REUS W/TWL LRG LVL3 (GOWN DISPOSABLE) ×3
GOWN STRL REUS W/TWL XL LVL3 (GOWN DISPOSABLE) ×9
IMPLANT RISE 8X22 9-15MM (Neuro Prosthesis/Implant) ×4 IMPLANT
KIT BASIN OR (CUSTOM PROCEDURE TRAY) ×3 IMPLANT
KIT INFUSE MEDIUM (Orthopedic Implant) ×2 IMPLANT
KIT ROOM TURNOVER OR (KITS) ×3 IMPLANT
MILL MEDIUM DISP (BLADE) ×2 IMPLANT
NDL HYPO 18GX1.5 BLUNT FILL (NEEDLE) IMPLANT
NDL HYPO 21X1.5 SAFETY (NEEDLE) IMPLANT
NDL HYPO 25X1 1.5 SAFETY (NEEDLE) IMPLANT
NEEDLE HYPO 18GX1.5 BLUNT FILL (NEEDLE) IMPLANT
NEEDLE HYPO 21X1.5 SAFETY (NEEDLE) IMPLANT
NEEDLE HYPO 25X1 1.5 SAFETY (NEEDLE) IMPLANT
NS IRRIG 1000ML POUR BTL (IV SOLUTION) ×3 IMPLANT
PACK FOAM VITOSS 10CC (Orthopedic Implant) ×2 IMPLANT
PACK LAMINECTOMY NEURO (CUSTOM PROCEDURE TRAY) ×3 IMPLANT
PAD ARMBOARD 7.5X6 YLW CONV (MISCELLANEOUS) ×9 IMPLANT
PATTIES SURGICAL .5 X1 (DISPOSABLE) ×3 IMPLANT
PATTIES SURGICAL .5 X3 (DISPOSABLE) IMPLANT
ROD REVERE CURVED 65MM (Rod) ×2 IMPLANT
ROD STRAIGHT REVERE 6.35 65MM (Rod) ×2 IMPLANT
SCREW REVERE 5.5X45 (Screw) ×4 IMPLANT
SPONGE LAP 4X18 X RAY DECT (DISPOSABLE) IMPLANT
SPONGE NEURO XRAY DETECT 1X3 (DISPOSABLE) IMPLANT
SPONGE SURGIFOAM ABS GEL 100 (HEMOSTASIS) ×3 IMPLANT
STRIP CLOSURE SKIN 1/2X4 (GAUZE/BANDAGES/DRESSINGS) ×2 IMPLANT
SUT NURALON 4 0 TR CR/8 (SUTURE) ×2 IMPLANT
SUT VIC AB 1 CT1 18XBRD ANBCTR (SUTURE) ×2 IMPLANT
SUT VIC AB 1 CT1 8-18 (SUTURE) ×6
SUT VIC AB 2-0 CP2 18 (SUTURE) ×5 IMPLANT
SUT VIC AB 3-0 SH 8-18 (SUTURE) ×3 IMPLANT
SYR 20CC LL (SYRINGE) ×2 IMPLANT
SYR 20ML ECCENTRIC (SYRINGE) ×3 IMPLANT
SYR 5ML LL (SYRINGE) IMPLANT
TAPE CLOTH SURG 4X10 WHT LF (GAUZE/BANDAGES/DRESSINGS) ×2 IMPLANT
TOWEL OR 17X24 6PK STRL BLUE (TOWEL DISPOSABLE) ×3 IMPLANT
TOWEL OR 17X26 10 PK STRL BLUE (TOWEL DISPOSABLE) ×3 IMPLANT
TRAY FOLEY W/METER SILVER 14FR (SET/KITS/TRAYS/PACK) ×3 IMPLANT
WATER STERILE IRR 1000ML POUR (IV SOLUTION) ×3 IMPLANT

## 2015-01-14 NOTE — Transfer of Care (Signed)
Immediate Anesthesia Transfer of Care Note  Patient: Audrey [name]  Procedure(s) Performed: Procedure(s): POSTERIOR LUMBAR INTERBODY FUSION WITH AUGMENTATION LUMBAR FIVE -SACRAL ONE (N/A)  Patient Location: PACU  Anesthesia Type:General  Level of Consciousness: awake, alert  and oriented  Airway & Oxygen Therapy: Patient Spontanous Breathing and Patient connected to nasal cannula oxygen  Post-op Assessment: Report given to RN, Post -op Vital signs reviewed and stable and Patient moving all extremities X 4  Post vital signs: Reviewed and stable  Last Vitals:  Filed Vitals:   01/14/15 1334  BP: 162/81  Pulse: 76  Temp: 36.8 C  Resp: 11    Complications: No apparent anesthesia complications

## 2015-01-14 NOTE — Anesthesia Postprocedure Evaluation (Signed)
  Anesthesia Post-op Note  Patient: Audrey [name]  Procedure(s) Performed: Procedure(s): POSTERIOR LUMBAR INTERBODY FUSION WITH AUGMENTATION LUMBAR FIVE -SACRAL ONE (N/A)  Patient Location: PACU  Anesthesia Type:General  Level of Consciousness: awake, alert , oriented and patient cooperative  Airway and Oxygen Therapy: Patient Spontanous Breathing and Patient connected to nasal cannula oxygen  Post-op Pain: mild  Post-op Assessment: Post-op Vital signs reviewed, Patient's Cardiovascular Status Stable, Respiratory Function Stable, Patent Airway, No signs of Nausea or vomiting and Pain level controlled LLE Motor Response: Purposeful movement LLE Sensation: Full sensation RLE Motor Response: Purposeful movement RLE Sensation: Full sensation      Post-op Vital Signs: Reviewed and unstable  Last Vitals:  Filed Vitals:   01/14/15 1413  BP: 158/73  Pulse: 72  Temp: 36.5 C  Resp: 17    Complications: No apparent anesthesia complications

## 2015-01-14 NOTE — Anesthesia Preprocedure Evaluation (Signed)
Anesthesia Evaluation  Patient identified by MRN, date of birth, ID band Patient awake    Reviewed: Allergy & Precautions, NPO status , Patient's Chart, lab work & pertinent test results  History of Anesthesia Complications (+) PONV and history of anesthetic complications  Airway Mallampati: II  TM Distance: >3 FB Neck ROM: Full    Dental  (+) Missing, Chipped, Dental Advisory Given   Pulmonary former smoker (quit 1982),  breath sounds clear to auscultation        Cardiovascular hypertension, Pt. on medications and Pt. on home beta blockers - anginaRhythm:Regular Rate:Normal     Neuro/Psych Chronic back pain    GI/Hepatic negative GI ROS, Neg liver ROS,   Endo/Other  diabetes (glu 166)Morbid obesity  Renal/GU Renal InsufficiencyRenal disease (creat 1.29)negative Renal ROS     Musculoskeletal  (+) Arthritis -, Osteoarthritis,    Abdominal (+) + obese,   Peds  Hematology   Anesthesia Other Findings   Reproductive/Obstetrics                             Anesthesia Physical Anesthesia Plan  ASA: III  Anesthesia Plan: General   Post-op Pain Management:    Induction: Intravenous  Airway Management Planned: Oral ETT  Additional Equipment:   Intra-op Plan:   Post-operative Plan: Extubation in OR  Informed Consent: I have reviewed the patients History and Physical, chart, labs and discussed the procedure including the risks, benefits and alternatives for the proposed anesthesia with the patient or authorized representative who has indicated his/her understanding and acceptance.   Dental advisory given  Plan Discussed with: CRNA and Surgeon  Anesthesia Plan Comments: (Plan routine monitors, GETA)        Anesthesia Quick Evaluation

## 2015-01-14 NOTE — Progress Notes (Addendum)
Pt arrived to 4N29 at 1435.  Pt A&O x 4, c/o 8/10 lower back surgical pain, site covered with CDI bulky gauze dressing, no drains.   Pt V/S taken,  pt still on O2 from PACU, fluids running at 75 cc/hr.  Foley intact, unclamped. Pt without distress.  Family updated about current location.  PCA setup ordered, Diet ordered, will monitor.

## 2015-01-14 NOTE — Anesthesia Procedure Notes (Signed)
Procedure Name: Intubation Date/Time: 01/14/2015 8:36 AM Performed by: Maryland Pink Pre-anesthesia Checklist: Patient identified, Emergency Drugs available, Suction available, Patient being monitored and Timeout performed Patient Re-evaluated:Patient Re-evaluated prior to inductionOxygen Delivery Method: Circle system utilized Preoxygenation: Pre-oxygenation with 100% oxygen Intubation Type: IV induction Ventilation: Mask ventilation without difficulty Laryngoscope Size: Mac and 3 Grade View: Grade I Tube type: Oral Tube size: 7.0 mm Number of attempts: 1 Airway Equipment and Method: Stylet and LTA kit utilized Placement Confirmation: ETT inserted through vocal cords under direct vision,  positive ETCO2 and breath sounds checked- equal and bilateral Secured at: 20 cm Tube secured with: Tape Dental Injury: Teeth and Oropharynx as per pre-operative assessment

## 2015-01-14 NOTE — Progress Notes (Signed)
Orthopedic Tech Progress Note Patient Details:  [name] June 30, 1951 [id number] Called order in to [employer]. Patient ID: Audrey [name], female   DOB: [birth year], 64 y.o.   MRN: [id number]   Darrol Poke 01/14/2015, 4:30 PM

## 2015-01-14 NOTE — Progress Notes (Signed)
Pt also c/o 9/10 headache with repositioning, specifically when she raises her head.

## 2015-01-15 ENCOUNTER — Inpatient Hospital Stay (HOSPITAL_COMMUNITY): Payer: BLUE CROSS/BLUE SHIELD

## 2015-01-15 ENCOUNTER — Encounter (HOSPITAL_COMMUNITY): Payer: Self-pay | Admitting: *Deleted

## 2015-01-15 DIAGNOSIS — I160 Hypertensive urgency: Secondary | ICD-10-CM | POA: Diagnosis present

## 2015-01-15 LAB — BASIC METABOLIC PANEL
Anion gap: 12 (ref 5–15)
BUN: 13 mg/dL (ref 6–20)
CO2: 24 mmol/L (ref 22–32)
CREATININE: 1.26 mg/dL — AB (ref 0.44–1.00)
Calcium: 8.8 mg/dL — ABNORMAL LOW (ref 8.9–10.3)
Chloride: 92 mmol/L — ABNORMAL LOW (ref 101–111)
GFR calc non Af Amer: 44 mL/min — ABNORMAL LOW (ref >60)
GFR, EST AFRICAN AMERICAN: 51 mL/min — AB (ref >60)
Glucose, Bld: 183 mg/dL — ABNORMAL HIGH (ref 65–99)
Potassium: 4.8 mmol/L (ref 3.5–5.1)
Sodium: 128 mmol/L — ABNORMAL LOW (ref 135–145)

## 2015-01-15 LAB — URINALYSIS, ROUTINE W REFLEX MICROSCOPIC
BILIRUBIN URINE: NEGATIVE
Glucose, UA: 100 mg/dL — AB
Hgb urine dipstick: NEGATIVE
Ketones, ur: NEGATIVE mg/dL
Leukocytes, UA: NEGATIVE
Nitrite: NEGATIVE
PROTEIN: NEGATIVE mg/dL
Specific Gravity, Urine: 1.017 (ref 1.005–1.030)
Urobilinogen, UA: 0.2 mg/dL (ref 0.0–1.0)
pH: 5 (ref 5.0–8.0)

## 2015-01-15 LAB — CBC WITH DIFFERENTIAL/PLATELET
BASOS ABS: 0 10*3/uL (ref 0.0–0.1)
Basophils Relative: 0 % (ref 0–1)
Eosinophils Absolute: 0 10*3/uL (ref 0.0–0.7)
Eosinophils Relative: 0 % (ref 0–5)
HCT: 36.6 % (ref 36.0–46.0)
Hemoglobin: 12.8 g/dL (ref 12.0–15.0)
Lymphocytes Relative: 6 % — ABNORMAL LOW (ref 12–46)
Lymphs Abs: 1.2 10*3/uL (ref 0.7–4.0)
MCH: 29.7 pg (ref 26.0–34.0)
MCHC: 35 g/dL (ref 30.0–36.0)
MCV: 84.9 fL (ref 78.0–100.0)
Monocytes Absolute: 1.6 10*3/uL — ABNORMAL HIGH (ref 0.1–1.0)
Monocytes Relative: 8 % (ref 3–12)
NEUTROS ABS: 16.8 10*3/uL — AB (ref 1.7–7.7)
Neutrophils Relative %: 86 % — ABNORMAL HIGH (ref 43–77)
Platelets: ADEQUATE 10*3/uL (ref 150–400)
RBC: 4.31 MIL/uL (ref 3.87–5.11)
RDW: 13.4 % (ref 11.5–15.5)
SMEAR REVIEW: ADEQUATE
WBC: 19.6 10*3/uL — ABNORMAL HIGH (ref 4.0–10.5)

## 2015-01-15 LAB — BLOOD GAS, ARTERIAL
Acid-Base Excess: 1.9 mmol/L (ref 0.0–2.0)
Bicarbonate: 25.7 mEq/L — ABNORMAL HIGH (ref 20.0–24.0)
Drawn by: 319961
O2 CONTENT: 2 L/min
O2 Saturation: 99.2 %
PATIENT TEMPERATURE: 98.6
PH ART: 7.437 (ref 7.350–7.450)
TCO2: 26.9 mmol/L (ref 0–100)
pCO2 arterial: 38.8 mmHg (ref 35.0–45.0)
pO2, Arterial: 143 mmHg — ABNORMAL HIGH (ref 80.0–100.0)

## 2015-01-15 LAB — PROTIME-INR
INR: 1.13 (ref 0.00–1.49)
PROTHROMBIN TIME: 14.7 s (ref 11.6–15.2)

## 2015-01-15 LAB — GLUCOSE, CAPILLARY
GLUCOSE-CAPILLARY: 169 mg/dL — AB (ref 65–99)
Glucose-Capillary: 174 mg/dL — ABNORMAL HIGH (ref 65–99)

## 2015-01-15 LAB — PROCALCITONIN: Procalcitonin: 0.38 ng/mL

## 2015-01-15 LAB — D-DIMER, QUANTITATIVE: D-Dimer, Quant: 3.97 ug/mL-FEU — ABNORMAL HIGH (ref 0.00–0.48)

## 2015-01-15 LAB — LACTIC ACID, PLASMA
Lactic Acid, Venous: 1.6 mmol/L (ref 0.5–2.0)
Lactic Acid, Venous: 1.4 mmol/L (ref 0.5–2.0)

## 2015-01-15 LAB — MRSA PCR SCREENING: MRSA by PCR: NEGATIVE

## 2015-01-15 LAB — APTT: aPTT: 37 seconds (ref 24–37)

## 2015-01-15 MED ORDER — HYDROMORPHONE HCL 1 MG/ML IJ SOLN
0.5000 mg | INTRAMUSCULAR | Status: DC | PRN
  Administered 2015-01-15 – 2015-01-17 (×7): 1 mg via INTRAVENOUS
  Filled 2015-01-15 (×8): qty 1

## 2015-01-15 MED ORDER — HYDRALAZINE HCL 20 MG/ML IJ SOLN
10.0000 mg | INTRAMUSCULAR | Status: DC | PRN
  Administered 2015-01-15 (×2): 10 mg via INTRAVENOUS
  Filled 2015-01-15 (×3): qty 1

## 2015-01-15 MED ORDER — PNEUMOCOCCAL VAC POLYVALENT 25 MCG/0.5ML IJ INJ
0.5000 mL | INJECTION | INTRAMUSCULAR | Status: AC
  Administered 2015-01-16: 0.5 mL via INTRAMUSCULAR
  Filled 2015-01-15: qty 0.5

## 2015-01-15 MED ORDER — BENAZEPRIL HCL 20 MG PO TABS
20.0000 mg | ORAL_TABLET | Freq: Once | ORAL | Status: AC
Start: 2015-01-15 — End: 2015-01-15
  Administered 2015-01-15: 20 mg via ORAL
  Filled 2015-01-15: qty 1

## 2015-01-15 MED ORDER — PIPERACILLIN-TAZOBACTAM 3.375 G IVPB 30 MIN
3.3750 g | Freq: Once | INTRAVENOUS | Status: DC
  Filled 2015-01-15: qty 50

## 2015-01-15 MED ORDER — PIPERACILLIN-TAZOBACTAM 3.375 G IVPB
3.3750 g | Freq: Three times a day (TID) | INTRAVENOUS | Status: DC
  Administered 2015-01-15 – 2015-01-19 (×11): 3.375 g via INTRAVENOUS
  Filled 2015-01-15 (×14): qty 50

## 2015-01-15 MED ORDER — BENAZEPRIL HCL 40 MG PO TABS
40.0000 mg | ORAL_TABLET | Freq: Every day | ORAL | Status: DC
  Administered 2015-01-16 – 2015-01-19 (×4): 40 mg via ORAL
  Filled 2015-01-15 (×4): qty 1

## 2015-01-15 MED ORDER — TRAMADOL HCL 50 MG PO TABS
50.0000 mg | ORAL_TABLET | Freq: Four times a day (QID) | ORAL | Status: DC | PRN
  Administered 2015-01-16 – 2015-01-22 (×14): 50 mg via ORAL
  Filled 2015-01-15 (×14): qty 1

## 2015-01-15 MED ORDER — IOHEXOL 350 MG/ML SOLN
100.0000 mL | Freq: Once | INTRAVENOUS | Status: AC | PRN
  Administered 2015-01-15: 100 mL via INTRAVENOUS

## 2015-01-15 MED ORDER — HYDROCHLOROTHIAZIDE 12.5 MG PO CAPS
12.5000 mg | ORAL_CAPSULE | Freq: Every day | ORAL | Status: DC
  Administered 2015-01-16: 12.5 mg via ORAL
  Filled 2015-01-15 (×2): qty 1

## 2015-01-15 MED ORDER — VANCOMYCIN HCL IN DEXTROSE 1-5 GM/200ML-% IV SOLN
1000.0000 mg | Freq: Once | INTRAVENOUS | Status: DC
  Filled 2015-01-15: qty 200

## 2015-01-15 MED ORDER — HYDRALAZINE HCL 20 MG/ML IJ SOLN
10.0000 mg | Freq: Once | INTRAMUSCULAR | Status: AC
  Administered 2015-01-15: 10 mg via INTRAVENOUS
  Filled 2015-01-15: qty 1

## 2015-01-15 MED ORDER — VANCOMYCIN HCL IN DEXTROSE 750-5 MG/150ML-% IV SOLN
750.0000 mg | Freq: Two times a day (BID) | INTRAVENOUS | Status: DC
  Administered 2015-01-15 – 2015-01-19 (×8): 750 mg via INTRAVENOUS
  Filled 2015-01-15 (×10): qty 150

## 2015-01-15 MED FILL — Sodium Chloride IV Soln 0.9%: INTRAVENOUS | Qty: 1000 | Status: AC

## 2015-01-15 MED FILL — Heparin Sodium (Porcine) Inj 1000 Unit/ML: INTRAMUSCULAR | Qty: 30 | Status: AC

## 2015-01-15 NOTE — Consult Note (Addendum)
[facility] Medical Consultation  KARRIGAN MESSAMORE VZC:[id number] DOB: [birth year] DOA: 01/14/2015 PCP: [name], [name], MD   Requesting physician: Dr. Joya Salm Date of consultation: 01/15/2015 Reason for consultation: HTN, lethargy  HPI:  64 yo F with history of HTN, degenerative disk disease, admitted to the neurosurgical service with L5-S1 stenosis, chronic radiculopathy, neurogenic claudication and is now s/p L4-L5 fusion. Throughout the day today patient has been increasingly hypertensive and per RN more lethargic, abd BP has been in the 128N systolic in the past 1-2 hours. TRH asked for consult for hypertension. She currently has no specific complaints, denies chest pain, denies shortness of breath, has a mild HA, denies visual changes. She is AxOx4 and has no complaints. She states that her BP is generally well controlled, however she does remember at one point in the past when it was in the 867E systolic.   Review of Systems:  As per HPI otherwise 10 point ROS negative  Impression/Recommendations Active Problems:   Degenerative disc disease, lumbar   Hypertensive urgency    1. Hypertensive urgency  - will provide IV hydralazine 10 mg now then PRN - increase her ACEI to 40, additional 20 now - no chest pain - monitor on telemetry  - she is complaining of back pain, this may contribute however due to #2 will avoid narcotics.   2. Lethargy - CXR, ABG pending, she answers all questions appropriately. Per RN, apparently she is much better now when compared to earlier. Closely monitor. Avoid narcotics and benzodiazepines. Add tramadol - CBC, CMP per Dr. Joya Salm, will follow   I will followup again tomorrow. Please contact me if I can be of assistance in the meanwhile. Thank you for this consultation.   Past Medical History  Diagnosis Date  . PONV (postoperative nausea and vomiting)   . Hypertension   . Headache(784.0)   . Arthritis   . Heart murmur     was told this  earlier..did an echo 1-1.5 yrs ago.  ?? the cardio   . Diabetes mellitus     dx early mid 2000.  Type 2    Not taking metformin....   Past Surgical History  Procedure Laterality Date  . Cervical fusion      2 yrs ago   at [facility]  . Carpal tunnel release      right hand  . Abdominal hysterectomy    . Breast biopsy      right  . Cholecystectomy     Social History:  reports that she quit smoking about 34 years ago. She does not have any smokeless tobacco history on file. She reports that she does not drink alcohol or use illicit drugs.  No Known Allergies History reviewed. No pertinent family history.  Prior to Admission medications   Medication Sig Start Date End Date Taking? Authorizing Provider  acetaminophen (TYLENOL) 650 MG CR tablet Take 650 mg by mouth every 8 (eight) hours as needed for pain.    Yes Historical Provider, MD  aspirin 81 MG chewable tablet Chew 81 mg by mouth.   Yes Historical Provider, MD  benazepril-hydrochlorthiazide (LOTENSIN HCT) 20-12.5 MG per tablet Take 2 tablets by mouth daily.   Yes Historical Provider, MD  Biotin 2500 MCG CAPS Take 2,500 mcg by mouth daily.    Yes Historical Provider, MD  Calcium-Magnesium-Zinc [phone] MG TABS Take 1 tablet by mouth 2 (two) times daily.   Yes Historical Provider, MD  Cholecalciferol (VITAMIN D-3) 5000 UNITS TABS Take 1 tablet  by mouth daily.   Yes Historical Provider, MD  Magnesium Hydroxide (MAGNESIA PO) Take 250 mg by mouth.   Yes Historical Provider, MD  metoprolol succinate (TOPROL-XL) 25 MG 24 hr tablet Take 25 mg by mouth daily.   Yes Historical Provider, MD  Multiple Vitamin (MULTIVITAMIN WITH MINERALS) TABS Take 1 tablet by mouth daily.   Yes Historical Provider, MD  Potassium Gluconate 595 MG CAPS Take 1 capsule by mouth daily.    Yes Historical Provider, MD  cetirizine (ZYRTEC) 10 MG tablet Take 10 mg by mouth daily as needed for allergies.     Historical Provider, MD  mometasone (NASONEX) 50 MCG/ACT nasal spray  Place 2 sprays into the nose daily as needed (allergies).     Historical Provider, MD   Physical Exam: Blood pressure 191/145, pulse 90, temperature 98.4 F (36.9 C), temperature source Oral, resp. rate 16, height 5\' 2"  (1.575 m), weight 87.998 kg (194 lb), SpO2 100 %. Filed Vitals:   01/15/15 1417  BP: 191/145  Pulse: 90  Temp:   Resp:     General:  NAD, pleasant AA female  Eyes: no scleral icterus, PERRL  Neck: supple, no JVD  Cardiovascular: RRR without MRG  Respiratory: CTA biL, no wheezing, no crackles  Abdomen: soft, non tender  Skin: no rashes  Psychiatric: normal mood and affect  Neurologic: non focal  CBG:  Recent Labs Lab 01/14/15 1337 01/14/15 1628 01/14/15 2157 01/15/15 0350 01/15/15 0642  GLUCAP 159* 195* 156* 169* 174*    Radiological Exams on Admission: Dg Lumbar Spine 2-3 Views  01/14/2015   CLINICAL DATA:  Intraoperative portable cross-table lateral views of the lumbar spine  EXAM: LUMBAR SPINE - 2-3 VIEW  COMPARISON:  Lumbar MRI of November 04, 2014  FINDINGS: The film labeled #1 reveals a tissue spreader device overlying the L5 spinous process. A surgical probe is present at this level with the upper tip along the posterior margin of the L5 vertebral body and the lower tip approximately 1 cm posterior to the posterior margin of S1. There are pedicle screws and connecting rods present from previous fusion. An intradiscal device is present. The image labeled #2 eveals 2 metallic probes present. The probe oriented superiorly has its tip along the posterior inferior surface of the body of L5 and the probe oriented inferiorly has its tip adjacent to the posterior cortex of S1. The tissue spreader device is unchanged in position.  IMPRESSION: Intraoperative images from lumbar surgery with findings as described.   Electronically Signed   By: David  [name] M.D.   On: 01/14/2015 13:54   Dg Lumbar Spine 2-3 Views  01/14/2015   CLINICAL DATA:  Lumbar fusion  EXAM:  DG C-ARM 61-120 MIN; LUMBAR SPINE - 2-3 VIEW  COMPARISON:  MRI 11/04/2014  FINDINGS: Lumbar vertebra are numbered as per prior MRI. L4-L5 and L5-S1 posterior and interbody fusion hardware noted.  IMPRESSION: Postsurgical changes lower lumbar spine .   Electronically Signed   By: Marcello Moores  Register   On: 01/14/2015 13:39   Dg Chest Port 1 View  01/15/2015   CLINICAL DATA:  Oxygen desaturation  EXAM: PORTABLE CHEST - 1 VIEW  COMPARISON:  January 02, 2014  FINDINGS: Lungs are clear. Heart size and pulmonary vascularity are normal. No adenopathy. There is postoperative change in the lower cervical region.  IMPRESSION: No edema or consolidation.   Electronically Signed   By: Lowella Grip III M.D.   On: 01/15/2015 14:34   Dg C-arm 1-60  Min  01/14/2015   CLINICAL DATA:  Lumbar fusion  EXAM: DG C-ARM 61-120 MIN; LUMBAR SPINE - 2-3 VIEW  COMPARISON:  MRI 11/04/2014  FINDINGS: Lumbar vertebra are numbered as per prior MRI. L4-L5 and L5-S1 posterior and interbody fusion hardware noted.  IMPRESSION: Postsurgical changes lower lumbar spine .   Electronically Signed   By: Marcello Moores  Register   On: 01/14/2015 13:39     Addendum: called later in the day that patient is febrile and tachycardic, more lethargic per RN.  - initiate sepsis protocol - transfer to [place], [place] [facility] Pager [phone]  If 7PM-7AM, please contact night-coverage [email or web address] Password [email or web address] 01/15/2015, 2:40 PM

## 2015-01-15 NOTE — Evaluation (Signed)
Physical Therapy Evaluation Patient Details Name: Audrey [name] MRN: [id number] DOB: 02/21/1951 Today's Date: 01/15/2015   History of Present Illness  64 y.o. female s/p posterior L5-S1 fusion.   Clinical Impression  Patient demonstrates deficits in functional mobility as indicated below. Will need continued skilled PT to address deficits and maximize function. Will see as indicated and progress as tolerated. OF NOTE: Session limited secondary to patient lethargy, dizziness, and hypertension at rest. Nsg in room at bedside with patient.    Follow Up Recommendations Home health PT;Supervision/Assistance - 24 hour    Equipment Recommendations  None recommended by PT    Recommendations for Other Services       Precautions / Restrictions Precautions Precautions: Back Precaution Booklet Issued: Yes (comment) Precaution Comments: handout provided and reviewed Required Braces or Orthoses: Spinal Brace Spinal Brace: Lumbar corset;Applied in sitting position Restrictions Weight Bearing Restrictions: No      Mobility  Bed Mobility Overal bed mobility: Needs Assistance Bed Mobility: Rolling;Sidelying to Sit;Sit to Sidelying Rolling: Min assist Sidelying to sit: Mod assist     Sit to sidelying: Mod assist General bed mobility comments: VCs for technique and sequencing, assist to roll to sidelying, assist to elevate trunk (increaesd time with patient hesitating/stopping mid transition x2) upon EOB patient with significant dizziness.  Transfers  No assessed secondary to medical issues during session         Ambulation/Gait                Stairs            Wheelchair Mobility    Modified Rankin (Stroke Patients Only)       Balance Overall balance assessment: Needs assistance Sitting-balance support: Single extremity supported;Feet supported Sitting balance-Leahy Scale: Poor Sitting balance - Comments: assist secondary to lethargy, was able to utilize  cloth to wash face at EOb with min guard but demonstrates inability to maintain control as arousal diminished. BP assessed EOB   Standing balance support: Bilateral upper extremity supported                                 Pertinent Vitals/Pain Pain Assessment: 0-10 Pain Score: 5  Pain Location: back Pain Intervention(s): Limited activity within patient's tolerance;Monitored during session;Repositioned;Relaxation;Patient requesting pain meds-RN notified    Home Living Family/patient expects to be discharged to:: Private residence Living Arrangements: Children Available Help at Discharge: Family;Available 24 hours/day Type of Home: House Home Access: Stairs to enter Entrance Stairs-Rails: None Entrance Stairs-Number of Steps: 5 Home Layout: One level Home Equipment: Walker - 2 wheels;Shower seat;Toilet riser;Grab bars - tub/shower;Hand held shower head      Prior Function Level of Independence: Independent               Hand Dominance   Dominant Hand: Right    Extremity/Trunk Assessment   Upper Extremity Assessment: Overall WFL for tasks assessed           Lower Extremity Assessment: Generalized weakness (reports heaviness and paresthesias bilateral LEs)         Communication   Communication: No difficulties  Cognition Arousal/Alertness: Lethargic Behavior During Therapy: Flat affect Overall Cognitive Status: Difficult to assess Area of Impairment: Memory;Following commands;Safety/judgement     Memory: Decreased recall of precautions;Decreased short-term memory Following Commands: Follows one step commands inconsistently Safety/Judgement: Decreased awareness of safety     General Comments: Pt unable to recite ABC's; required cuing to complete  A-Z and skipped letters.    General Comments General comments (skin integrity, edema, etc.): assist secondary to lethargy, was able to utilize cloth to wash face at EOb with min guard but  demonstrates inability to maintain control as arousal diminished. BP assessed EOB (539J systolic)    Exercises        Assessment/Plan    PT Assessment Patient needs continued PT services  PT Diagnosis Difficulty walking;Abnormality of gait;Generalized weakness;Acute pain;Altered mental status   PT Problem List Decreased strength;Decreased range of motion;Decreased activity tolerance;Decreased balance;Decreased mobility;Decreased coordination;Decreased cognition;Decreased knowledge of precautions;Obesity;Pain  PT Treatment Interventions DME instruction;Gait training;Stair training;Functional mobility training;Therapeutic activities;Therapeutic exercise;Balance training;Cognitive remediation;Patient/family education   PT Goals (Current goals can be found in the Care Plan section) Acute Rehab PT Goals Patient Stated Goal: per daughter (to go home) PT Goal Formulation: With patient/family Time For Goal Achievement: 01/29/15 Potential to Achieve Goals: Good    Frequency Min 5X/week   Barriers to discharge        Co-evaluation               End of Session   Activity Tolerance: Patient limited by fatigue;Patient limited by lethargy;Treatment limited secondary to medical complications (Comment) Patient left: in bed;with call bell/phone within reach;with bed alarm set;with nursing/sitter in room;with family/visitor present Nurse Communication: Mobility status         Time: 1005-1026 PT Time Calculation (min) (ACUTE ONLY): 21 min   Charges:   PT Evaluation $Initial PT Evaluation Tier I: 1 Procedure     PT G CodesDuncan Dull February 13, 2015, 2:45 PM Alben Deeds, [place] DPT  [phone]

## 2015-01-15 NOTE — Progress Notes (Signed)
Attempt to ambulate patient cancelled as patient is still very drowsy from morphine PCA.

## 2015-01-15 NOTE — Clinical Documentation Improvement (Signed)
Abnormal Lab and/or Testing Results: 7/07:  Sodium:  128.  Treatment provided: 7/06:  0.9% NaCl @ 75 ml/hr.   Possible Clinical Conditions: >  Hyponatremia >  Other >  Not able to determine    Thank you Russ Halo Documentation Specialist [phone] [email or web address]

## 2015-01-15 NOTE — Significant Event (Addendum)
Rapid Response Event Note  Overview: Time Called: 1520 Arrival Time: 1523 Event Type: Respiratory, Other (Comment)  Initial Focused Assessment: Patient HTN today taking PO meds, additonal PRN given. Now with labored breathing and increased HR. Shivering BP 190/88  ST with PACs 110-120s  RR 24  O2 sat 100% rectal temp 100.8 Lungs sound clear, heart tones irregular Patient states that she is having trouble breathing and feels like a limp rag. She is also mild confused and disoriented to time.   Interventions: PCXR/ABG done prior to my arrival CBC, Bmet drawn Placed on tele, 12 lead EKG done Dr Joya Salm notified. Dr Cruzita Lederer at bedside to assess patient. MD initiated sepsis order set,  Per md no fluid bolus at this time, continue MIVF Foley placed, urine sent Transfer to SDU, 3S12 Additional labs and [facility] drawn. 20 ga PIV started in right [facility] Family at bedside through out event.   Event Summary: Name of Physician Notified: Dr Joya Salm at 1600  Name of Consulting Physician Notified: Dr Monico Blitz at 1340  Outcome: Transferred (Comment)     Raliegh Ip

## 2015-01-15 NOTE — Progress Notes (Signed)
Utilization review completed. Alonzo Owczarzak, RN, BSN. 

## 2015-01-15 NOTE — Progress Notes (Signed)
Order received to D/C PCA as patient respirations kept dropping too often. New order of Dilaudid 0.5 mg-1 mg q 3hrs received. Will continue to monitor.

## 2015-01-15 NOTE — Evaluation (Signed)
Occupational Therapy Evaluation Patient Details Name: Audrey [name] MRN: [id number] DOB: February 08, 1951 Today's Date: 01/15/2015    History of Present Illness 64 y.o. female s/p posterior L5-S1 fusion.    Clinical Impression   Patient is s/p L5-S1 fusion surgery resulting in functional limitations due to the deficits listed below (see OT problem list). Pt very lethargic today, unable to keep eyes open sitting in chair or follow simple commands. Upon standing to return to bed, Pt reports dizziness. Pt instructed to verbalize ABC's, and was unable to complete full alphabet requiring VC's to contine and often skipping letters. Orthostatic vitals assessed: unable to determine standing BP, BP sitting 184/100, BP laying 207/107.  RN Mahala Menghini notified immediately. Patient will benefit from skilled OT acutely to increase independence and safety with ADLS prior to discharge.     Follow Up Recommendations  SNF;Other (comment) (TBD, pt limited due to BP and being lethargic)    Equipment Recommendations  Other (comment) (TBD)    Recommendations for Other Services       Precautions / Restrictions Precautions Precautions: Back Precaution Booklet Issued: Yes (comment) Precaution Comments: handout provided and reviewed Required Braces or Orthoses: Spinal Brace Spinal Brace: Lumbar corset;Applied in sitting position Restrictions Weight Bearing Restrictions: No      Mobility Bed Mobility Overal bed mobility: Needs Assistance Bed Mobility: Rolling;Sidelying to Sit;Sit to Sidelying Rolling: Min assist Sidelying to sit: Mod assist     Sit to sidelying: Mod assist General bed mobility comments: VCs for technique and sequencing, assist to roll to sidelying, assist to elevate trunk (increaesd time with patient hesitating/stopping mid transition x2) upon EOB patient with significant dizziness.  Transfers Overall transfer level: Needs assistance Equipment used: Rolling walker (2  wheeled) Transfers: Sit to/from Stand Sit to Stand: Min assist         General transfer comment: Pt reported dizziness upon standing    Balance Overall balance assessment: Needs assistance Sitting-balance support: Single extremity supported;Feet supported Sitting balance-Leahy Scale: Poor Sitting balance - Comments: assist secondary to lethargy, was able to utilize cloth to wash face at EOb with min guard but demonstrates inability to maintain control as arousal diminished. BP assessed EOB   Standing balance support: Bilateral upper extremity supported                                ADL Overall ADL's : Needs assistance/impaired Eating/Feeding: Independent;Sitting   Grooming: Wash/dry face;Set up;Sitting   Upper Body Bathing: Minimal assitance;Sitting   Lower Body Bathing: Sit to/from stand;Moderate assistance   Upper Body Dressing : Sitting;Maximal assistance (don brace)   Lower Body Dressing: Moderate assistance;Sit to/from stand   Toilet Transfer: Minimal assistance;Ambulation;RW;BSC   Toileting- Clothing Manipulation and Hygiene: Moderate assistance;Sit to/from stand;Cueing for back precautions   Tub/ Shower Transfer: Moderate assistance;Shower seat;Rolling walker;Ambulation   Functional mobility during ADLs: Minimal assistance;Rolling walker General ADL Comments: Pt limited in ability to complete ADLs at this time due to pain and BP     Vision Vision Assessment?: No apparent visual deficits   Perception     Praxis      Pertinent Vitals/Pain Pain Assessment: 0-10 Pain Score: 5  Pain Location: back Pain Intervention(s): Limited activity within patient's tolerance;Monitored during session;Repositioned;Relaxation;Patient requesting pain meds-RN notified     Hand Dominance Right   Extremity/Trunk Assessment Upper Extremity Assessment Upper Extremity Assessment: Overall WFL for tasks assessed   Lower Extremity Assessment Lower Extremity  Assessment: Generalized  weakness (reports heaviness and paresthesias bilateral LEs)       Communication Communication Communication: No difficulties   Cognition Arousal/Alertness: Lethargic Behavior During Therapy: Flat affect Overall Cognitive Status: Difficult to assess Area of Impairment: Memory;Following commands;Safety/judgement     Memory: Decreased recall of precautions;Decreased short-term memory Following Commands: Follows one step commands inconsistently Safety/Judgement: Decreased awareness of safety     General Comments: Pt unable to recite ABC's; required cuing to complete A-Z and skipped letters.   General Comments       Exercises       Shoulder Instructions      Home Living Family/patient expects to be discharged to:: Private residence Living Arrangements: Children Available Help at Discharge: Family;Available 24 hours/day Type of Home: House Home Access: Stairs to enter [employer] of Steps: 5 Entrance Stairs-Rails: None Home Layout: One level     Bathroom Shower/Tub: Tub/shower unit;Curtain Shower/tub characteristics: Architectural technologist: Standard     Home Equipment: Environmental consultant - 2 wheels;Shower seat;Toilet riser;Grab bars - tub/shower;Hand held shower head          Prior Functioning/Environment Level of Independence: Independent             OT Diagnosis: Generalized weakness;Cognitive deficits;Acute pain   OT Problem List: Decreased strength;Decreased activity tolerance;Impaired balance (sitting and/or standing);Decreased safety awareness;Decreased knowledge of use of DME or AE;Decreased knowledge of precautions;Pain   OT Treatment/Interventions: Self-care/ADL training;Therapeutic exercise;DME and/or AE instruction;Therapeutic activities;Patient/family education;Balance training    OT Goals(Current goals can be found in the care plan section) Acute Rehab OT Goals Patient Stated Goal: per daughter (to go home) OT Goal  Formulation: With patient Time For Goal Achievement: 01/29/15 ADL Goals Pt Will Perform Grooming: with supervision;standing Pt Will Perform Lower Body Bathing: with min assist;with adaptive equipment;sit to/from stand Pt Will Perform Upper Body Dressing: with modified independence;sitting (don brace) Pt Will Transfer to Toilet: with min guard assist;ambulating;bedside commode Pt Will Perform Tub/Shower Transfer: Tub transfer;with min assist;ambulating;rolling walker Additional ADL Goal #1: Pt will verbalize 3/3 precautions as precursor to ADLs  OT Frequency: Min 2X/week   Barriers to D/C:            Co-evaluation              End of Session Equipment Utilized During Treatment: Gait belt;Rolling walker;Back brace Nurse Communication: Mobility status;Precautions;Other (comment) (BP levels)  Activity Tolerance: Patient limited by lethargy Patient left: in bed;with call bell/phone within reach;with bed alarm set;with family/visitor present   Time: 0383-3383 OT Time Calculation (min): 32 min Charges:  OT General Charges $OT Visit: 1 Procedure OT Evaluation $Initial OT Evaluation Tier I: 1 Procedure OT Treatments $Self Care/Home Management : 8-22 mins G-Codes:    Forest Gleason 01/15/2015, 3:26 PM

## 2015-01-15 NOTE — Progress Notes (Signed)
Report given to [facility], [place] and patient transferred to [phone].

## 2015-01-15 NOTE — Progress Notes (Signed)
Patient ID: Audrey [name], female   DOB: [birth year], 64 y.o.   MRN: [id number] More awake, c/o incisional pain, no weakness, no sob. Ct chest -angio negative for PE. Spoke with family. hospitalist medical help appreciated.

## 2015-01-15 NOTE — Progress Notes (Signed)
ANTIBIOTIC CONSULT NOTE - INITIAL  Pharmacy Consult for Vancomycin, Zosyn Indication: rule out sepsis  No Known Allergies  Patient Measurements: Height: 5\' 2"  (157.5 cm) Weight: 194 lb (87.998 kg) IBW/kg (Calculated) : 50.1   Vital Signs: Temp: 100.8 F (38.2 C) (07/07 1549) Temp Source: Rectal (07/07 1549) BP: 190/88 mmHg (07/07 1542) Pulse Rate: 122 (07/07 1542) Intake/Output from previous day: 07/06 0701 - 07/07 0700 In: 2000 [I.V.:2000] Out: 2050 [Urine:1800; Blood:250]  Labs: No results for input(s): WBC, HGB, PLT, LABCREA, CREATININE in the last 72 hours. Estimated Creatinine Clearance: 46 mL/min (by C-G formula based on Cr of 1.29). No results for input(s): VANCOTROUGH, VANCOPEAK, VANCORANDOM, GENTTROUGH, GENTPEAK, GENTRANDOM, TOBRATROUGH, TOBRAPEAK, TOBRARND, AMIKACINPEAK, AMIKACINTROU, AMIKACIN in the last 72 hours.   Microbiology: Recent Results (from the past 720 hour(s))  Surgical pcr screen     Status: None   Collection Time: 01/06/15  9:32 AM  Result Value Ref Range Status   MRSA, PCR NEGATIVE NEGATIVE Final   Staphylococcus aureus NEGATIVE NEGATIVE Final    Comment:        The Xpert SA Assay (FDA approved for NASAL specimens in patients over 102 years of age), is one component of a comprehensive surveillance program.  Test performance has been validated by [facility] for patients greater than or equal to 22 year old. It is not intended to diagnose infection nor to guide or monitor treatment.     Medical History: Past Medical History  Diagnosis Date  . PONV (postoperative nausea and vomiting)   . Hypertension   . Headache(784.0)   . Arthritis   . Heart murmur     was told this earlier..did an echo 1-1.5 yrs ago.  ?? the cardio   . Diabetes mellitus     dx early mid 2000.  Type 2    Not taking metformin....    Assessment: 64 year old female s/p lumbar surgery 01/14/15 now with hypertension and lethargy.  Pharmacy asked to begin vancomycin  and Zosyn for rule out sepsis.  Goal of Therapy:  Vancomycin trough level 15-20 mcg/ml  Appropriate Zosyn dosing  Plan:  Vancomycin 750 mg iv Q 12 hours Zosyn 3.375 grams iv q 8 hours - 4 hr infusion Follow up Scr, cultures, fever curve, progress  Thank you. Anette Guarneri, PharmD [phone]  01/15/2015,4:08 PM

## 2015-01-15 NOTE — Op Note (Signed)
Audrey [name], Audrey [name]            ACCOUNT NO.:  [id number]  MEDICAL RECORD NO.:  [id number]  LOCATION:  [id number]                        FACILITY:  [facility]  PHYSICIAN:  Leeroy Cha, M.D.   DATE OF BIRTH:  1950/09/03  DATE OF PROCEDURE:  01/14/2015 DATE OF DISCHARGE:                              OPERATIVE REPORT   PREOPERATIVE DIAGNOSIS:  L5-S1 stenosis with degenerative disk disease, chronic radiculopathy, neurogenic claudication.  Status post L4-L5 fusion.  POSTOPERATIVE DIAGNOSIS:  L5-S1 stenosis with degenerative disk disease, chronic radiculopathy, neurogenic claudication.  Status post L4-L5 fusion.  PROCEDURES:  Bilateral L5 laminectomy and facetectomy.  Lysis of adhesion.  Bilaterally L5-S1 diskectomy more than normal to introduce 2 cages which were expanded to 15 mm height with BMP and autograft inside. Pedicle screws at the level of S1.  Augmentation with hooks to the rod from L4-L5 previous fusion.  Posterolateral arthrodesis with BMP, autograft, and Vitoss.  Lysis of adhesion.  Closure of arachnoid pouch. Cell Saver.  C-arm.  SURGEON:  Leeroy Cha, M.D.  ASSISTANT:  Dr. Sherley Bounds.  CLINICAL HISTORY:  Audrey [name] is a lady who in the past underwent fusion at L4-5.  The patient did well, but lately she has been complaining of back pain, worsened to both lower extremities associated with weakness and numbness.  The patient has failed with conservative treatment and x- ray showed that she has stenosis with severe degenerative disk disease at L5-S1.  The area where she had the surgery was clean.  Surgery was advised, and the patient knew the risk with the surgery including the possibility of failure of the fusion, CSF leak, hematoma, need of further surgery.  DESCRIPTION OF PROCEDURE:  The patient was taken to the OR.  After intubation, she was positioned in a prone manner.  The back was cleaned with DuraPrep, and midline incision was made following the previous  one through the skin and subcutaneous tissue.  We identified the head of the pedicle of L5.  From then on, we went laterally.  The patient had quite a bit of adhesion.  We identified the L5 spinous process which was removed with the [place], and we proceeded with laminectomy and went laterally to remove the facet bilaterally.  The patient had quite a bit of adhesion.  We opened the disk space, first on the right side and then the left side and total diskectomy bilaterally and laterally was done more than normal to be able to introduce 2 cages, which were expanded to 15 mm height.  The rest of the disk space was filled up with Vitoss and BMP.  From then on, using the C-arm first in the AP view and then in lateral view, we probed the pedicle of S1.  Pedicle screw 5.5 x 45 was used.  Prior to introduction of the screws, we felt all 4 quadrants just to be sure that we were surrounded by bone.  From then on, 2 hooks at the level of the rod of L4-L5 were used and we were able to introduce a new rod from these hooks to the pedicle screws. We had to tailor then just to be sure that they were in good position. From then on, the  area was irrigated.  Valsalva maneuver twice was negative.  The area was irrigated.  There was no bleeding.  Vancomycin was left in the operative site and the wound was closed with Vicryl and staples.          ______________________________ Leeroy Cha, M.D.     EB/MEDQ  D:  01/14/2015  T:  01/14/2015  Job:  [id number]

## 2015-01-15 NOTE — Clinical Social Work Note (Addendum)
CSW Consult Acknowledged:   CSW received a consult for SNF placement. CSW awaiting PT/OT evaluation to determine the appropriate level of care.      Addendum: Per PT's evaluation to the appropriate level of care is [place] will sign off.     Iraan, MSW, [place]

## 2015-01-15 NOTE — Progress Notes (Addendum)
Dr. Joya Salm notified regarding patient's hypertension at rest, and oxygen desaturation.  He ordered chest x-ray, cbc with diff and BMET.  Patient is denying SOB, chest discomfort and she is afebrile.  Will continue to monitor patient.

## 2015-01-16 DIAGNOSIS — E871 Hypo-osmolality and hyponatremia: Secondary | ICD-10-CM

## 2015-01-16 DIAGNOSIS — R5383 Other fatigue: Secondary | ICD-10-CM | POA: Diagnosis present

## 2015-01-16 DIAGNOSIS — N189 Chronic kidney disease, unspecified: Secondary | ICD-10-CM | POA: Diagnosis present

## 2015-01-16 LAB — COMPREHENSIVE METABOLIC PANEL
ALK PHOS: 86 U/L (ref 38–126)
ALT: 25 U/L (ref 14–54)
ANION GAP: 8 (ref 5–15)
AST: 40 U/L (ref 15–41)
Albumin: 3 g/dL — ABNORMAL LOW (ref 3.5–5.0)
BUN: 10 mg/dL (ref 6–20)
CO2: 29 mmol/L (ref 22–32)
Calcium: 8.5 mg/dL — ABNORMAL LOW (ref 8.9–10.3)
Chloride: 91 mmol/L — ABNORMAL LOW (ref 101–111)
Creatinine, Ser: 1.26 mg/dL — ABNORMAL HIGH (ref 0.44–1.00)
GFR calc Af Amer: 51 mL/min — ABNORMAL LOW (ref >60)
GFR calc non Af Amer: 44 mL/min — ABNORMAL LOW (ref >60)
Glucose, Bld: 160 mg/dL — ABNORMAL HIGH (ref 65–99)
Potassium: 3.9 mmol/L (ref 3.5–5.1)
Sodium: 128 mmol/L — ABNORMAL LOW (ref 135–145)
TOTAL PROTEIN: 6.3 g/dL — AB (ref 6.5–8.1)
Total Bilirubin: 1.5 mg/dL — ABNORMAL HIGH (ref 0.3–1.2)

## 2015-01-16 LAB — CBC WITH DIFFERENTIAL/PLATELET
BASOS ABS: 0 10*3/uL (ref 0.0–0.1)
Basophils Relative: 0 % (ref 0–1)
EOS ABS: 0 10*3/uL (ref 0.0–0.7)
EOS PCT: 0 % (ref 0–5)
HCT: 33.9 % — ABNORMAL LOW (ref 36.0–46.0)
Hemoglobin: 11.7 g/dL — ABNORMAL LOW (ref 12.0–15.0)
Lymphocytes Relative: 6 % — ABNORMAL LOW (ref 12–46)
Lymphs Abs: 1 10*3/uL (ref 0.7–4.0)
MCH: 29.6 pg (ref 26.0–34.0)
MCHC: 34.5 g/dL (ref 30.0–36.0)
MCV: 85.8 fL (ref 78.0–100.0)
Monocytes Absolute: 1.4 10*3/uL — ABNORMAL HIGH (ref 0.1–1.0)
Monocytes Relative: 8 % (ref 3–12)
Neutro Abs: 14.7 10*3/uL — ABNORMAL HIGH (ref 1.7–7.7)
Neutrophils Relative %: 86 % — ABNORMAL HIGH (ref 43–77)
PLATELETS: 208 10*3/uL (ref 150–400)
RBC: 3.95 MIL/uL (ref 3.87–5.11)
RDW: 13.5 % (ref 11.5–15.5)
WBC: 17 10*3/uL — AB (ref 4.0–10.5)

## 2015-01-16 LAB — OSMOLALITY, URINE: Osmolality, Ur: 276 mOsm/kg — ABNORMAL LOW (ref 390–1090)

## 2015-01-16 LAB — URIC ACID: Uric Acid, Serum: 3.2 mg/dL (ref 2.3–6.6)

## 2015-01-16 LAB — SODIUM, URINE, RANDOM: SODIUM UR: 82 mmol/L

## 2015-01-16 MED ORDER — METOPROLOL SUCCINATE ER 50 MG PO TB24
50.0000 mg | ORAL_TABLET | Freq: Every day | ORAL | Status: DC
  Administered 2015-01-17: 50 mg via ORAL
  Filled 2015-01-16: qty 1

## 2015-01-16 MED ORDER — METOPROLOL SUCCINATE ER 25 MG PO TB24
25.0000 mg | ORAL_TABLET | Freq: Once | ORAL | Status: AC
  Administered 2015-01-16: 25 mg via ORAL
  Filled 2015-01-16: qty 1

## 2015-01-16 NOTE — Progress Notes (Signed)
CM tentatively arranged for HHPT per recommendations  from PT. Choice provided to pt and pt agreed with [facility]. Referral called and made with [facility] per CM. HHPT  order will be needed @ discharged from MD. CM to f/u with disposition needs. Whitman Hero RN, [place] [phone]

## 2015-01-16 NOTE — Progress Notes (Signed)
Patient ID: Audrey [name], female   DOB: [birth year], 64 y.o.   MRN: [id number] More awake, no respiratory distress, no weakness. Low sodium being. Pt to see and help. corrected

## 2015-01-16 NOTE — Consult Note (Signed)
[place] Consultation  Audrey [name] OVF:[id number] DOB: 1951/03/31 DOA: 01/14/2015 PCP: [name], [name], MD   Requesting physician: Dr Leeroy Cha (neurosurgery). Date of consultation: 01/15/2015 Reason for consultation: HTN, lethargy  Impression/Recommendations Active Problems:   Degenerative disc disease, lumbar   Hypertensive urgency   Hypertensive urgency - will provide IV hydralazine 10 mg now then PRN -Increase Toprol XL to 50 mg daily - Continue Benazepril 40 mg Daily  -Continue HCTZ 12.5 mg daily - no chest pain - monitor on telemetry  - she is not complaining of back pain  Hyponatremia/SIADH? -Stable -Continue normal saline at 3ml/hr -Obtain urine sodium, serum osmolality, urine osmolality, uric acid -Will consider stopping Dyazide diarrhetic  Chronic Renal failure -Patient's last creatinine 1.17 back in 03/19/2012 -Monitor closely  Lethargy - CXR,  -ABG essentially normal  she answers all questions appropriately. Per RN, apparently she is much better now when compared to earlier. Closely monitor. Avoid narcotics and benzodiazepines.  -Continue tramadol 50 mg QID PRN    I will followup again tomorrow. Please contact me if I can be of assistance in the meanwhile. Thank you for this consultation.  Chief Complaint: Hypertension, lethargy  HPI:  64 yo BF PMHx HTN, Diabetes Type 2, Degenerative disk disease.  Admitted to the neurosurgical service with L5-S1 stenosis, chronic radiculopathy, neurogenic claudication and is now s/p L4-L5 fusion. Throughout the day today patient has been increasingly hypertensive and per RN more lethargic, abd BP has been in the 841Y systolic in the past 1-2 hours. TRH asked for consult for hypertension. She currently has no specific complaints, denies chest pain, denies shortness of breath, has a mild HA, denies visual changes. She is AxOx4 and has no complaints. She states that her BP is generally well  controlled, however she does remember at one point in the past when it was in the 606T systolic.   7/8 Overnight MAXIMUM TEMPERATURE 38.2C. A/O 4, though somewhat sleepy. Daughter states that patient has waxing and waning periods of disorientation. States currently negative CP/SOB/N/V. Negative back pain.   Review of symptoms The patient denies anorexia, fever, weight loss,, vision loss, decreased hearing, hoarseness, chest pain, syncope, dyspnea on exertion, peripheral edema, balance deficits, hemoptysis, abdominal pain, melena, hematochezia, severe indigestion/heartburn, hematuria, incontinence, genital sores, suspicious skin lesions, transient blindness, depression, unusual weight change, abnormal bleeding, enlarged lymph nodes, angioedema, and breast masses.   Consultants:   Procedure/Significant Events: 7/7 CT angiogram chest PE protocol; . No pulmonary emboli or acute abnormality. - Mild interstitial prominence in the lower lobes. Mild active inflammation vs infection vs mild interstitial pulmonary edema. - Multiple small, subcentimeter thyroid nodules.     Culture 7/7 blood pending 7/7 MRSA by PCR negative   Antibiotics: Zosyn 7/7>> Vancomycin 7/7>>   DVT prophylaxis: SCD   Past Medical History  Diagnosis Date  . PONV (postoperative nausea and vomiting)   . Hypertension   . Headache(784.0)   . Arthritis   . Heart murmur     was told this earlier..did an echo 1-1.5 yrs ago.  ?? the cardio   . Diabetes mellitus     dx early mid 2000.  Type 2    Not taking metformin....   Past Surgical History  Procedure Laterality Date  . Cervical fusion      2 yrs ago   at [facility]  . Carpal tunnel release      right hand  . Abdominal hysterectomy    . Breast biopsy  right  . Cholecystectomy     Social History:  reports that she quit smoking about 34 years ago. She does not have any smokeless tobacco history on file. She reports that she does not drink alcohol or use illicit  drugs.  No Known Allergies History reviewed. No pertinent family history.  Prior to Admission medications   Medication Sig Start Date End Date Taking? Authorizing Provider  acetaminophen (TYLENOL) 650 MG CR tablet Take 650 mg by mouth every 8 (eight) hours as needed for pain.    Yes Historical Provider, MD  aspirin 81 MG chewable tablet Chew 81 mg by mouth.   Yes Historical Provider, MD  benazepril-hydrochlorthiazide (LOTENSIN HCT) 20-12.5 MG per tablet Take 2 tablets by mouth daily.   Yes Historical Provider, MD  Biotin 2500 MCG CAPS Take 2,500 mcg by mouth daily.    Yes Historical Provider, MD  Calcium-Magnesium-Zinc [phone] MG TABS Take 1 tablet by mouth 2 (two) times daily.   Yes Historical Provider, MD  Cholecalciferol (VITAMIN D-3) 5000 UNITS TABS Take 1 tablet by mouth daily.   Yes Historical Provider, MD  Magnesium Hydroxide (MAGNESIA PO) Take 250 mg by mouth.   Yes Historical Provider, MD  metoprolol succinate (TOPROL-XL) 25 MG 24 hr tablet Take 25 mg by mouth daily.   Yes Historical Provider, MD  Multiple Vitamin (MULTIVITAMIN WITH MINERALS) TABS Take 1 tablet by mouth daily.   Yes Historical Provider, MD  Potassium Gluconate 595 MG CAPS Take 1 capsule by mouth daily.    Yes Historical Provider, MD  cetirizine (ZYRTEC) 10 MG tablet Take 10 mg by mouth daily as needed for allergies.     Historical Provider, MD  mometasone (NASONEX) 50 MCG/ACT nasal spray Place 2 sprays into the nose daily as needed (allergies).     Historical Provider, MD   Physical Exam: Blood pressure 167/97, pulse 52, temperature 98.1 F (36.7 C), temperature source Oral, resp. rate 18, height 5\' 2"  (1.575 m), weight 93.2 kg (205 lb 7.5 oz), SpO2 100 %. Filed Vitals:   01/16/15 1202 01/16/15 1249 01/16/15 1654 01/16/15 1704  BP:  174/95  167/97  Pulse:  93  52  Temp: 98.3 F (36.8 C)  98.1 F (36.7 C)   TempSrc: Oral     Resp:  17  18  Height:      Weight:      SpO2:  97%  100%     General:  Sleepy,  but arousable, A/O 4, NAD  Eyes: Equal reactive to light and accommodation  Neck: Negative lymphadenopathy, negative JVD  Cardiovascular: Regular rhythm and rate, negative murmurs rubs or gallops, normal S1/S2  Respiratory: Clear to auscultation bilateral  Abdomen: Soft, nontender, nondistended, plus bowel sound  Skin: Incision midline lumbar area covered and clean negative sign of infection  Musculoskeletal: Negative pedal edema  Neurologic: Cranial nerves II through XII intact, tongue/uvula midline, moves all extremities, sensation intact  Labs on Admission:  Basic Metabolic Panel:  Recent Labs Lab 01/15/15 1550 01/16/15 1046  NA 128* 128*  K 4.8 3.9  CL 92* 91*  CO2 24 29  GLUCOSE 183* 160*  BUN 13 10  CREATININE 1.26* 1.26*  CALCIUM 8.8* 8.5*   Liver Function Tests:  Recent Labs Lab 01/16/15 1046  AST 40  ALT 25  ALKPHOS 86  BILITOT 1.5*  PROT 6.3*  ALBUMIN 3.0*   No results for input(s): LIPASE, AMYLASE in the last 168 hours. No results for input(s): AMMONIA in the last 168 hours.  CBC:  Recent Labs Lab 01/15/15 1550 01/16/15 1046  WBC 19.6* 17.0*  NEUTROABS 16.8* 14.7*  HGB 12.8 11.7*  HCT 36.6 33.9*  MCV 84.9 85.8  PLT PLATELETS APPEAR ADEQUATE 208   Cardiac Enzymes: No results for input(s): CKTOTAL, CKMB, CKMBINDEX, TROPONINI in the last 168 hours. BNP: Invalid input(s): POCBNP CBG:  Recent Labs Lab 01/14/15 1337 01/14/15 1628 01/14/15 2157 01/15/15 0350 01/15/15 0642  GLUCAP 159* 195* 156* 169* 174*    Radiological Exams on Admission: Ct Angio Chest Pe W/cm &/or Wo Cm  01/15/2015   CLINICAL DATA:  Dyspnea.  EXAM: CT ANGIOGRAPHY CHEST WITH CONTRAST  TECHNIQUE: Multidetector CT imaging of the chest was performed using the standard protocol during bolus administration of intravenous contrast. Multiplanar CT image reconstructions and MIPs were obtained to evaluate the vascular anatomy.  CONTRAST:  174mL OMNIPAQUE IOHEXOL 350 MG/ML  SOLN  COMPARISON:  Portable chest obtained earlier today.  FINDINGS: Multiple small, subcentimeter thyroid nodules. Normally opacified pulmonary arteries with no pulmonary arterial filling defects. Mildly prominent interstitial markings at the lung bases. No lung nodules or enlarged lymph nodes. Cholecystectomy clips. Thoracic spine degenerative changes.  Review of the MIP images confirms the above findings.  IMPRESSION: 1. No pulmonary emboli or acute abnormality. 2. Mild interstitial prominence in the lower lobes. This could be chronic, due to mild active inflammation or infection or mild interstitial pulmonary edema. 3. Multiple small, subcentimeter thyroid nodules. These are too small to characterize, but most likely benign in the absence of known clinical risk factors for thyroid carcinoma.   Electronically Signed   By: Claudie Revering M.D.   On: 01/15/2015 18:32   Dg Chest Port 1 View  01/15/2015   CLINICAL DATA:  Oxygen desaturation  EXAM: PORTABLE CHEST - 1 VIEW  COMPARISON:  January 02, 2014  FINDINGS: Lungs are clear. Heart size and pulmonary vascularity are normal. No adenopathy. There is postoperative change in the lower cervical region.  IMPRESSION: No edema or consolidation.   Electronically Signed   By: Lowella Grip III M.D.   On: 01/15/2015 14:34    EKG:   Care during the described time interval was provided by me .  I have reviewed this patient's available data, including medical history, events of note, physical examination, and all test results as part of my evaluation. I have personally reviewed and interpreted all radiology studies.  Time spent: 40 minutes  Allie Bossier [facility] Pager [phone]  If 7PM-7AM, please contact night-coverage [email or web address] Password [email or web address] 01/16/2015, 7:09 PM

## 2015-01-16 NOTE — Progress Notes (Signed)
Physical Therapy Treatment Patient Details Name: Audrey [name] MRN: [id number] DOB: 1950/10/07 Today's Date: 01/16/2015    History of Present Illness 64 y.o. female s/p posterior L5-S1 fusion.     PT Comments    Pt admitted with above diagnosis. Pt currently with functional limitations due to balance and endurance deficits. Pt ambulated today progressing with that but continues to be limited by dizziness and lethargy.  Pt had muscle relaxer and Dilaudid prior to PT arrival.   Pt will benefit from skilled PT to increase their independence and safety with mobility to allow discharge to the venue listed below.    Follow Up Recommendations  Home health PT;Supervision/Assistance - 24 hour     Equipment Recommendations  None recommended by PT    Recommendations for Other Services       Precautions / Restrictions Precautions Precautions: Back Precaution Booklet Issued: Yes (comment) Precaution Comments: handout provided and reviewed Required Braces or Orthoses: Spinal Brace Spinal Brace: Lumbar corset;Applied in sitting position Restrictions Weight Bearing Restrictions: No    Mobility  Bed Mobility Overal bed mobility: Needs Assistance Bed Mobility: Rolling;Sidelying to Sit;Sit to Sidelying Rolling: Min assist Sidelying to sit: Mod assist       General bed mobility comments: VCs for technique and sequencing, assist to roll to sidelying, assist to elevate trunk (increaesd time with patient hesitating/stopping mid transition x2) upon EOB patient with significant dizziness.  Transfers Overall transfer level: Needs assistance Equipment used: Rolling walker (2 wheeled) Transfers: Sit to/from Stand Sit to Stand: Min assist         General transfer comment: Pt reported dizziness upon standing.  Pt total assist to don brace.    Ambulation/Gait Ambulation/Gait assistance: Min guard;+2 safety/equipment Ambulation Distance (Feet): 100 Feet Assistive device: Rolling walker  (2 wheeled) Gait Pattern/deviations: Decreased stride length;Decreased step length - right;Decreased step length - left;Step-to pattern   Gait velocity interpretation: <1.8 ft/sec, indicative of risk for recurrent falls General Gait Details: Pt ambulated with RW with slow cadence and fair overall stability.     Stairs            Wheelchair Mobility    Modified Rankin (Stroke Patients Only)       Balance Overall balance assessment: Needs assistance Sitting-balance support: Feet supported;Bilateral upper extremity supported Sitting balance-Leahy Scale: Poor Sitting balance - Comments: assist secondary to lethargy   Standing balance support: Bilateral upper extremity supported;During functional activity Standing balance-Leahy Scale: Poor Standing balance comment: requires Ue support.                      Cognition Arousal/Alertness: Lethargic;Suspect due to medications Behavior During Therapy: Flat affect Overall Cognitive Status: Difficult to assess Area of Impairment: Memory;Following commands;Safety/judgement     Memory: Decreased recall of precautions;Decreased short-term memory Following Commands: Follows one step commands inconsistently Safety/Judgement: Decreased awareness of safety     General Comments: Still with poor awareness and lethargy - had muscle relaxer and Dilaudid prior to session.      Exercises      General Comments General comments (skin integrity, edema, etc.): Continues with lethargy and dizziness.  BP somewhat high. Nurse notified.       Pertinent Vitals/Pain Pain Assessment: 0-10 Pain Score: 6  Pain Location: back Pain Descriptors / Indicators: Aching;Grimacing;Guarding Pain Intervention(s): Limited activity within patient's tolerance;Monitored during session;Premedicated before session;Repositioned   BP initially 155/141.  193/87 upon departure.  HR 95-108 bpm, Sat >90% on RA with ambulation.  Replaced O2  at 2 LO2 with reading  at 98% due to pt still receiving pain meds and is lethargic.    Home Living                      Prior Function            PT Goals (current goals can now be found in the care plan section) Progress towards PT goals: Progressing toward goals    Frequency  Min 5X/week    PT Plan Current plan remains appropriate    Co-evaluation             End of Session Equipment Utilized During Treatment: Gait belt;Oxygen Activity Tolerance: Patient limited by fatigue Patient left: in chair;with call bell/phone within reach;with family/visitor present     Time: 0413-6438 PT Time Calculation (min) (ACUTE ONLY): 26 min  Charges:  $Gait Training: 23-37 mins                    G Codes:      Audrey [name], Audrey [name] 02/02/15, 2:15 PM Audrey [name],PT Acute Rehabilitation [phone] (pager)

## 2015-01-17 DIAGNOSIS — E222 Syndrome of inappropriate secretion of antidiuretic hormone: Secondary | ICD-10-CM

## 2015-01-17 DIAGNOSIS — R441 Visual hallucinations: Secondary | ICD-10-CM | POA: Diagnosis present

## 2015-01-17 LAB — OSMOLALITY: OSMOLALITY: 277 mosm/kg (ref 275–300)

## 2015-01-17 MED ORDER — HYDRALAZINE HCL 10 MG PO TABS
10.0000 mg | ORAL_TABLET | Freq: Four times a day (QID) | ORAL | Status: DC
  Administered 2015-01-17 – 2015-01-19 (×8): 10 mg via ORAL
  Filled 2015-01-17 (×11): qty 1

## 2015-01-17 MED ORDER — METOPROLOL SUCCINATE ER 50 MG PO TB24
75.0000 mg | ORAL_TABLET | Freq: Every day | ORAL | Status: DC
  Administered 2015-01-18 – 2015-01-19 (×2): 75 mg via ORAL
  Filled 2015-01-17 (×3): qty 1

## 2015-01-17 MED ORDER — METOPROLOL SUCCINATE ER 25 MG PO TB24
25.0000 mg | ORAL_TABLET | Freq: Once | ORAL | Status: AC
Start: 2015-01-17 — End: 2015-01-17
  Administered 2015-01-17: 25 mg via ORAL
  Filled 2015-01-17: qty 1

## 2015-01-17 NOTE — Progress Notes (Signed)
Patient ID: Audrey [name], female   DOB: August 15, 1950, 64 y.o.   MRN: [id number] Afebril, sleepy but wakes up with voice. No headache, no stiffness of the neck. Minimal postop drainage in lumbar wound otherwise normal healing. No leg pain, no SOB. hospitalist service helping

## 2015-01-17 NOTE — Consult Note (Signed)
[facility] Consultation  MAURIA ASQUITH DXI:[id number] DOB: March 19, 1951 DOA: 01/14/2015 PCP: [name], [name], MD   Requesting physician: Dr Leeroy Cha (neurosurgery). 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 Per RN, apparently she is much better now when compared to earlier. Closely monitor. Avoid narcotics and benzodiazepines.  -Continue tramadol 50 mg QID PRN   Hallucinations -As patients narcotics and benzodiazepine said been discontinued, most likely hospital delirium   I will followup again tomorrow. Please contact me if I can be of assistance in the meanwhile. Thank you for this consultation.  Chief Complaint: Hypertension, lethargy  HPI:  64 yo BF PMHx HTN, Diabetes Type 2, Degenerative disk disease.  Admitted to the neurosurgical service with L5-S1 stenosis, chronic radiculopathy, neurogenic claudication and is now s/p L4-L5 fusion. Throughout the day today patient has been increasingly hypertensive and per RN more lethargic, abd BP has been in the 767H systolic in the past 1-2 hours. TRH asked for consult for hypertension. She currently has no specific complaints, denies chest pain, denies shortness of breath, has a mild HA, denies visual changes. She is AxOx4 and has no  complaints. She states that her BP is generally well controlled, however she does remember at one point in the past when it was in the 419F systolic.   7/8 Overnight MAXIMUM TEMPERATURE 38.2C. A/O 4, though somewhat sleepy. Daughter states that patient has waxing and waning periods of disorientation. States currently negative CP/SOB/N/V. Negative back pain. 7/9 A/O 4, however bedside sitter reported that patient was hallucinating (seeing her granddaughter sitting in the corner/hearing her speak when she was not present)  Review of symptoms The patient denies anorexia, fever, weight loss,, vision loss, decreased hearing, hoarseness, chest pain, syncope, dyspnea on exertion, peripheral edema, balance deficits, hemoptysis, abdominal pain, melena, hematochezia, severe indigestion/heartburn, hematuria, incontinence, genital sores, suspicious skin lesions, transient blindness, depression, unusual weight change, abnormal bleeding, enlarged lymph nodes, angioedema, and breast masses.   Consultants:   Procedure/Significant Events: 7/7 CT angiogram chest PE protocol; . No pulmonary emboli or acute abnormality. - Mild interstitial prominence in the lower lobes. Mild active inflammation vs infection vs mild interstitial pulmonary edema. - Multiple small, subcentimeter thyroid nodules.     Culture 7/7 blood pending 7/7 MRSA by PCR negative   Antibiotics: Zosyn 7/7>> Vancomycin 7/7>>   DVT prophylaxis: SCD   Past Medical History  Diagnosis Date  . PONV (postoperative nausea and vomiting)   . Hypertension   . Headache(784.0)   . Arthritis   . Heart murmur     was told this earlier..did an echo 1-1.5 yrs ago.  ?? the cardio   . Diabetes mellitus     dx early mid 2000.  Type 2    Not taking metformin....   Past Surgical History  Procedure Laterality Date  . Cervical fusion      2 yrs ago  at [facility]  . Carpal tunnel release      right hand  . Abdominal hysterectomy    . Breast biopsy       right  . Cholecystectomy     Social History:  reports that she quit smoking about 34 years ago. She does not have any smokeless tobacco history on file. She reports that she does not drink alcohol or use illicit drugs.  No Known Allergies History reviewed. No pertinent family history.  Prior to Admission medications   Medication Sig Start Date End Date Taking? Authorizing Provider  acetaminophen (TYLENOL) 650 MG CR tablet Take 650 mg by mouth every 8 (eight) hours as needed for pain.    Yes Historical Provider, MD  aspirin 81 MG chewable tablet Chew 81 mg by mouth.   Yes Historical Provider, MD  benazepril-hydrochlorthiazide (LOTENSIN HCT) 20-12.5 MG per tablet Take 2 tablets by mouth daily.   Yes Historical Provider, MD  Biotin 2500 MCG CAPS Take 2,500 mcg by mouth daily.    Yes Historical Provider, MD  Calcium-Magnesium-Zinc [phone] MG TABS Take 1 tablet by mouth 2 (two) times daily.   Yes Historical Provider, MD  Cholecalciferol (VITAMIN D-3) 5000 UNITS TABS Take 1 tablet by mouth daily.   Yes Historical Provider, MD  Magnesium Hydroxide (MAGNESIA PO) Take 250 mg by mouth.   Yes Historical Provider, MD  metoprolol succinate (TOPROL-XL) 25 MG 24 hr tablet Take 25 mg by mouth daily.   Yes Historical Provider, MD  Multiple Vitamin (MULTIVITAMIN WITH MINERALS) TABS Take 1 tablet by mouth daily.   Yes Historical Provider, MD  Potassium Gluconate 595 MG CAPS Take 1 capsule by mouth daily.    Yes Historical Provider, MD  cetirizine (ZYRTEC) 10 MG tablet Take 10 mg by mouth daily as needed for allergies.     Historical Provider, MD  mometasone (NASONEX) 50 MCG/ACT nasal spray Place 2 sprays into the nose daily as needed (allergies).     Historical Provider, MD   Physical Exam: Blood pressure 172/87, pulse 90, temperature 97.8 F (36.6 C), temperature source Oral, resp. rate 25, height 5\' 2"  (1.575 m), weight 93.2 kg (205 lb 7.5 oz), SpO2 96 %. Filed Vitals:   01/17/15 1530 01/17/15 1605  01/17/15 1939 01/17/15 2136  BP:  173/124 189/91 172/87  Pulse:  93 89 90  Temp: 97.9 F (36.6 C)  97.8 F (36.6 C)   TempSrc: Oral  Oral   Resp:  24 14 25   Height:      Weight:      SpO2:  95% 99% 96%     General:  Sleepy, but arousable, A/O 4, NAD, having hallucinations  Eyes: Equal reactive to light and accommodation  Neck: Negative lymphadenopathy, negative JVD  Cardiovascular: Regular rhythm and rate, negative murmurs rubs or gallops, normal S1/S2  Respiratory: Clear to auscultation bilateral  Abdomen: Soft, nontender, nondistended, plus bowel sound  Skin: Incision midline lumbar area covered and clean negative sign of infection  Musculoskeletal: Negative pedal edema  Neurologic: Cranial nerves II through XII intact, tongue/uvula midline, moves all extremities, sensation intact  Labs on Admission:  Basic Metabolic Panel:  Recent Labs Lab 01/15/15 1550 01/16/15 1046  NA 128* 128*  K 4.8 3.9  CL 92* 91*  CO2 24 29  GLUCOSE 183* 160*  BUN 13 10  CREATININE 1.26* 1.26*  CALCIUM 8.8* 8.5*   Liver Function Tests:  Recent Labs Lab 01/16/15 1046  AST 40  ALT 25  ALKPHOS 86  BILITOT 1.5*  PROT 6.3*  ALBUMIN 3.0*   No results for input(s): LIPASE, AMYLASE in the last 168 hours. No results for input(s): AMMONIA in the last 168 hours. CBC:  Recent Labs Lab 01/15/15 1550 01/16/15 1046  WBC 19.6* 17.0*  NEUTROABS 16.8* 14.7*  HGB 12.8 11.7*  HCT 36.6 33.9*  MCV 84.9 85.8  PLT PLATELETS APPEAR ADEQUATE 208   Cardiac Enzymes: No results for input(s): CKTOTAL, CKMB, CKMBINDEX, TROPONINI in the last 168 hours. BNP: Invalid input(s): POCBNP CBG:  Recent Labs Lab 01/14/15 1337 01/14/15 1628 01/14/15 2157 01/15/15 0350 01/15/15 0642  GLUCAP 159* 195* 156* 169* 174*    Radiological Exams on Admission: No results found.  EKG:   Care during the described time interval was provided by me .  I have reviewed this patient's available data,  including medical history, events of note, physical examination, and all test results as part of my evaluation. I have personally reviewed and interpreted all radiology studies.  Time spent: 40 minutes  Allie Bossier [facility] Pager [phone]  If 7PM-7AM, please contact night-coverage [email or web address] Password [email or web address] 01/17/2015, 10:22 PM

## 2015-01-17 NOTE — Progress Notes (Signed)
Physical Therapy Treatment Patient Details Name: Audrey [name] MRN: [id number] DOB: January 23, 1951 Today's Date: 01/17/2015    History of Present Illness 64 y.o. female s/p posterior L5-S1 fusion.     PT Comments    Pt admitted with above diagnosis. Pt currently with functional limitations due to cognitive and endurance deficits. Pt continues to stay lethargic.  Had Dilaudid per nurse.  She still ambulated but with poor safety. Will have to have 24 hour care.   Pt will benefit from skilled PT to increase their independence and safety with mobility to allow discharge to the venue listed below.    Follow Up Recommendations  Home health PT;Supervision/Assistance - 24 hour (If no 24 hour care, NHP recommended.  )     Equipment Recommendations  None recommended by PT    Recommendations for Other Services       Precautions / Restrictions Precautions Precautions: Back Precaution Booklet Issued: Yes (comment) Precaution Comments: handout provided and reviewed Required Braces or Orthoses: Spinal Brace Spinal Brace: Lumbar corset;Applied in sitting position Restrictions Weight Bearing Restrictions: No    Mobility  Bed Mobility               General bed mobility comments: in chair.  Had Dilaudid per nurse.  Transfers Overall transfer level: Needs assistance Equipment used: Rolling walker (2 wheeled) Transfers: Sit to/from Stand Sit to Stand: Min assist;Mod assist         General transfer comment: Pt reported dizziness upon standing.  Pt total assist to adjust  brace.    Ambulation/Gait Ambulation/Gait assistance: Min guard;+2 safety/equipment Ambulation Distance (Feet): 300 Feet Assistive device: Rolling walker (2 wheeled) Gait Pattern/deviations: Step-through pattern;Decreased stride length;Decreased step length - right;Decreased step length - left   Gait velocity interpretation: <1.8 ft/sec, indicative of risk for recurrent falls General Gait Details: Pt ambulated  with RW with slow cadence and fair overall stability.  Cues needed to steer RW as she steered left.     Stairs            Wheelchair Mobility    Modified Rankin (Stroke Patients Only)       Balance Overall balance assessment: Needs assistance         Standing balance support: Bilateral upper extremity supported;During functional activity Standing balance-Leahy Scale: Poor Standing balance comment: still requiring UE support for balance.                     Cognition Arousal/Alertness: Lethargic;Suspect due to medications Behavior During Therapy: Flat affect Overall Cognitive Status: Difficult to assess Area of Impairment: Memory;Following commands;Safety/judgement     Memory: Decreased recall of precautions;Decreased short-term memory Following Commands: Follows one step commands inconsistently Safety/Judgement: Decreased awareness of safety     General Comments: Still with poor awareness and lethargy - had muscle relaxer and Dilaudid prior to session.      Exercises      General Comments General comments (skin integrity, edema, etc.): continues with lethargy and dizziness. When got back to room after walk, pt fell asleep and could not arouse.  Nurse aware.        Pertinent Vitals/Pain Pain Assessment: Faces Faces Pain Scale: Hurts little more Pain Location: back Pain Descriptors / Indicators: Grimacing Pain Intervention(s): Limited activity within patient's tolerance;Monitored during session;Premedicated before session;Repositioned  VSS    Home Living                      Prior Function  PT Goals (current goals can now be found in the care plan section) Progress towards PT goals: Progressing toward goals (Although slow due to lethargy)    Frequency  Min 3X/week    PT Plan Discharge plan needs to be updated;Frequency needs to be updated    Co-evaluation             End of Session Equipment Utilized During  Treatment: Gait belt;Oxygen Activity Tolerance: Patient limited by fatigue Patient left: in chair;with call bell/phone within reach;with nursing/sitter in room     Time: 0951-1007 PT Time Calculation (min) (ACUTE ONLY): 16 min  Charges:  $Gait Training: 8-22 mins                    G CodesDenice Paradise 13-Feb-2015, 11:37 AM [name] Acute Rehabilitation [phone] (pager)

## 2015-01-17 NOTE — Progress Notes (Signed)
Patient has become increasingly confused throughout the night.  Pt is impulsive with poor safety awareness and I have contacted the MD about a sitter at bedside to help prevent falls

## 2015-01-18 LAB — BASIC METABOLIC PANEL
Anion gap: 11 (ref 5–15)
BUN: 14 mg/dL (ref 6–20)
CO2: 29 mmol/L (ref 22–32)
Calcium: 8.9 mg/dL (ref 8.9–10.3)
Chloride: 89 mmol/L — ABNORMAL LOW (ref 101–111)
Creatinine, Ser: 1.17 mg/dL — ABNORMAL HIGH (ref 0.44–1.00)
GFR, EST AFRICAN AMERICAN: 56 mL/min — AB (ref >60)
GFR, EST NON AFRICAN AMERICAN: 49 mL/min — AB (ref >60)
GLUCOSE: 180 mg/dL — AB (ref 65–99)
POTASSIUM: 3.2 mmol/L — AB (ref 3.5–5.1)
SODIUM: 129 mmol/L — AB (ref 135–145)

## 2015-01-18 LAB — CBC WITH DIFFERENTIAL/PLATELET
Basophils Absolute: 0 10*3/uL (ref 0.0–0.1)
Basophils Relative: 0 % (ref 0–1)
Eosinophils Absolute: 0.1 10*3/uL (ref 0.0–0.7)
Eosinophils Relative: 1 % (ref 0–5)
HCT: 36.1 % (ref 36.0–46.0)
Hemoglobin: 12.4 g/dL (ref 12.0–15.0)
LYMPHS PCT: 12 % (ref 12–46)
Lymphs Abs: 1.2 10*3/uL (ref 0.7–4.0)
MCH: 29.4 pg (ref 26.0–34.0)
MCHC: 34.3 g/dL (ref 30.0–36.0)
MCV: 85.5 fL (ref 78.0–100.0)
Monocytes Absolute: 0.8 10*3/uL (ref 0.1–1.0)
Monocytes Relative: 8 % (ref 3–12)
NEUTROS ABS: 8.3 10*3/uL — AB (ref 1.7–7.7)
Neutrophils Relative %: 79 % — ABNORMAL HIGH (ref 43–77)
PLATELETS: 300 10*3/uL (ref 150–400)
RBC: 4.22 MIL/uL (ref 3.87–5.11)
RDW: 13.4 % (ref 11.5–15.5)
WBC: 10.5 10*3/uL (ref 4.0–10.5)

## 2015-01-18 LAB — MAGNESIUM: MAGNESIUM: 1.6 mg/dL — AB (ref 1.7–2.4)

## 2015-01-18 LAB — VANCOMYCIN, TROUGH: Vancomycin Tr: 18 ug/mL (ref 10.0–20.0)

## 2015-01-18 MED ORDER — WHITE PETROLATUM GEL
Status: AC
Start: 2015-01-18 — End: 2015-01-19
  Administered 2015-01-19
  Filled 2015-01-18: qty 1

## 2015-01-18 MED ORDER — BISACODYL 5 MG PO TBEC
10.0000 mg | DELAYED_RELEASE_TABLET | Freq: Once | ORAL | Status: AC
  Administered 2015-01-18: 10 mg via ORAL
  Filled 2015-01-18: qty 2

## 2015-01-18 NOTE — Consult Note (Signed)
[facility] Consultation  NHU GLASBY GLO:[id number] DOB: March 09, 1951 DOA: 01/14/2015 PCP: [name], [name], MD   Requesting physician: Dr Leeroy Cha (neurosurgery). 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 Avoid narcotics and benzodiazepines.  -Continue tramadol 50 mg QID PRN   Hallucinations -Resolved    I will followup again tomorrow. Please contact me if I can be of assistance in the meanwhile. Thank you for this consultation.  Chief Complaint: Hypertension, lethargy  HPI:  64 yo BF PMHx HTN, Diabetes Type 2, Degenerative disk disease.  Admitted to the neurosurgical service with L5-S1 stenosis, chronic radiculopathy, neurogenic claudication and is now s/p L4-L5 fusion. Throughout the day today patient has been increasingly hypertensive and per RN more lethargic, abd BP has been in the 188C systolic in the past 1-2 hours. TRH asked for consult for hypertension. She currently has no specific complaints, denies chest pain, denies shortness of breath, has a mild HA, denies visual changes. She is AxOx4 and has no complaints. She states that her BP is generally well controlled, however she does remember at one point in the past when it was in the 166A systolic.   7/8 Overnight MAXIMUM TEMPERATURE 38.2C. A/O 4, though somewhat sleepy. Daughter states that patient has waxing and  waning periods of disorientation. States currently negative CP/SOB/N/V. Negative back pain. 7/9 A/O 4, however bedside sitter reported that patient was hallucinating (seeing her granddaughter sitting in the corner/hearing her speak when she was not present)  Review of symptoms The patient denies anorexia, fever, weight loss,, vision loss, decreased hearing, hoarseness, chest pain, syncope, dyspnea on exertion, peripheral edema, balance deficits, hemoptysis, abdominal pain, melena, hematochezia, severe indigestion/heartburn, hematuria, incontinence, genital sores, suspicious skin lesions, transient blindness, depression, unusual weight change, abnormal bleeding, enlarged lymph nodes, angioedema, and breast masses.   Consultants:   Procedure/Significant Events: 7/7 CT angiogram chest PE protocol; . No pulmonary emboli or acute abnormality. - Mild interstitial prominence in the lower lobes. Mild active inflammation vs infection vs mild interstitial pulmonary edema. - Multiple small, subcentimeter thyroid nodules.     Culture 7/7 blood pending 7/7 MRSA by PCR negative   Antibiotics: Zosyn 7/7>> Vancomycin 7/7>>   DVT prophylaxis: SCD   Past Medical History  Diagnosis Date  . PONV (postoperative nausea and vomiting)   . Hypertension   . Headache(784.0)   . Arthritis   . Heart murmur     was told this earlier..did an echo 1-1.5 yrs ago.  ?? the cardio   . Diabetes mellitus     dx early mid 2000.  Type 2    Not taking metformin....   Past Surgical History  Procedure Laterality Date  . Cervical fusion      2 yrs ago   at [facility]  . Carpal tunnel release      right hand  . Abdominal hysterectomy    . Breast biopsy      right  . Cholecystectomy     Social History:  reports  that she quit smoking about 34 years ago. She does not have any smokeless tobacco history on file. She reports that she does not drink alcohol or use illicit drugs.  No Known Allergies History reviewed. No  pertinent family history.  Prior to Admission medications   Medication Sig Start Date End Date Taking? Authorizing Provider  acetaminophen (TYLENOL) 650 MG CR tablet Take 650 mg by mouth every 8 (eight) hours as needed for pain.    Yes Historical Provider, MD  aspirin 81 MG chewable tablet Chew 81 mg by mouth.   Yes Historical Provider, MD  benazepril-hydrochlorthiazide (LOTENSIN HCT) 20-12.5 MG per tablet Take 2 tablets by mouth daily.   Yes Historical Provider, MD  Biotin 2500 MCG CAPS Take 2,500 mcg by mouth daily.    Yes Historical Provider, MD  Calcium-Magnesium-Zinc [id number] MG TABS Take 1 tablet by mouth 2 (two) times daily.   Yes Historical Provider, MD  Cholecalciferol (VITAMIN D-3) 5000 UNITS TABS Take 1 tablet by mouth daily.   Yes Historical Provider, MD  Magnesium Hydroxide (MAGNESIA PO) Take 250 mg by mouth.   Yes Historical Provider, MD  metoprolol succinate (TOPROL-XL) 25 MG 24 hr tablet Take 25 mg by mouth daily.   Yes Historical Provider, MD  Multiple Vitamin (MULTIVITAMIN WITH MINERALS) TABS Take 1 tablet by mouth daily.   Yes Historical Provider, MD  Potassium Gluconate 595 MG CAPS Take 1 capsule by mouth daily.    Yes Historical Provider, MD  cetirizine (ZYRTEC) 10 MG tablet Take 10 mg by mouth daily as needed for allergies.     Historical Provider, MD  mometasone (NASONEX) 50 MCG/ACT nasal spray Place 2 sprays into the nose daily as needed (allergies).     Historical Provider, MD   Physical Exam: Blood pressure 143/87, pulse 71, temperature 97.7 F (36.5 C), temperature source Oral, resp. rate 15, height 5\' 2"  (1.575 m), weight 93.2 kg (205 lb 7.5 oz), SpO2 99 %. Filed Vitals:   01/18/15 1000 01/18/15 1043 01/18/15 1118 01/18/15 1523  BP: 121/75  143/87   Pulse: 80  71   Temp:  97.5 F (36.4 C)  97.7 F (36.5 C)  TempSrc:  Oral  Oral  Resp: 15  15   Height:      Weight:      SpO2: 99%  99%      General:   A/O 4, NAD, hallucinations have resolved  Eyes:  Equal reactive to light and accommodation  Neck: Negative lymphadenopathy, negative JVD  Cardiovascular: Regular rhythm and rate, negative murmurs rubs or gallops, normal S1/S2  Respiratory: Clear to auscultation bilateral  Abdomen: Soft, nontender, nondistended, plus bowel sound  Skin: Incision midline lumbar area, staples in place small amount of serous fluid, covered and clean negative sign of infection  Musculoskeletal: Negative pedal edema  Neurologic: Cranial nerves II through XII intact, tongue/uvula midline, moves all extremities, sensation intact  Labs on Admission:  Basic Metabolic Panel:  Recent Labs Lab 01/15/15 1550 01/16/15 1046 01/18/15 0957  NA 128* 128* 129*  K 4.8 3.9 3.2*  CL 92* 91* 89*  CO2 24 29 29   GLUCOSE 183* 160* 180*  BUN 13 10 14   CREATININE 1.26* 1.26* 1.17*  CALCIUM 8.8* 8.5* 8.9  MG  --   --  1.6*   Liver Function Tests:  Recent Labs Lab 01/16/15 1046  AST 40  ALT 25  ALKPHOS 86  BILITOT 1.5*  PROT 6.3*  ALBUMIN 3.0*   No results for input(s): LIPASE,  AMYLASE in the last 168 hours. No results for input(s): AMMONIA in the last 168 hours. CBC:  Recent Labs Lab 01/15/15 1550 01/16/15 1046 01/18/15 0957  WBC 19.6* 17.0* 10.5  NEUTROABS 16.8* 14.7* 8.3*  HGB 12.8 11.7* 12.4  HCT 36.6 33.9* 36.1  MCV 84.9 85.8 85.5  PLT PLATELETS APPEAR ADEQUATE 208 300   Cardiac Enzymes: No results for input(s): CKTOTAL, CKMB, CKMBINDEX, TROPONINI in the last 168 hours. BNP: Invalid input(s): POCBNP CBG:  Recent Labs Lab 01/14/15 1337 01/14/15 1628 01/14/15 2157 01/15/15 0350 01/15/15 0642  GLUCAP 159* 195* 156* 169* 174*    Radiological Exams on Admission: No results found.  EKG:   Care during the described time interval was provided by me .  I have reviewed this patient's available data, including medical history, events of note, physical examination, and all test results as part of my evaluation. I have personally reviewed  and interpreted all radiology studies.  Time spent: 40 minutes  Allie Bossier [facility] Pager [phone]  If 7PM-7AM, please contact night-coverage [email or web address] Password [facility] 01/18/2015, 6:47 PM

## 2015-01-18 NOTE — Progress Notes (Signed)
Patient ID: Audrey [name], female   DOB: Dec 27, 1950, 64 y.o.   MRN: [id number] More awake, afebril, wound dry. Ambulating with help

## 2015-01-18 NOTE — Progress Notes (Signed)
Patient ID: Audrey [name], female   DOB: May 03, 1951, 64 y.o.   MRN: [id number] BP 143/87 mmHg  Pulse 71  Temp(Src) 97.5 F (36.4 C) (Oral)  Resp 15  Ht 5\' 2"  (1.575 m)  Wt 93.2 kg (205 lb 7.5 oz)  BMI 37.57 kg/m2  SpO2 99% Alert and oriented x 4 Speech is clear, fluent Normal strength in all extremities Wound is clean, dry

## 2015-01-18 NOTE — Progress Notes (Signed)
ANTIBIOTIC CONSULT NOTE - FOLLOW UP  Pharmacy Consult for vancomycin Indication: rule out sepsis  No Known Allergies  Patient Measurements: Height: 5\' 2"  (157.5 cm) Weight: 205 lb 7.5 oz (93.2 kg) IBW/kg (Calculated) : 50.1 Adjusted Body Weight:   Vital Signs: Temp: 97.7 F (36.5 C) (07/10 1523) Temp Source: Oral (07/10 1523) BP: 143/87 mmHg (07/10 1118) Pulse Rate: 71 (07/10 1118) Intake/Output from previous day: 07/09 0701 - 07/10 0700 In: 2088.8 [P.O.:360; I.V.:1228.8; IV Piggyback:500] Out: 1575 [Urine:1575] Intake/Output from this shift: Total I/O In: 240 [P.O.:240] Out: 675 [Urine:675]  Labs:  Recent Labs  01/16/15 1046 01/18/15 0957  WBC 17.0* 10.5  HGB 11.7* 12.4  PLT 208 300  CREATININE 1.26* 1.17*   Estimated Creatinine Clearance: 52.3 mL/min (by C-G formula based on Cr of 1.17).  Recent Labs  01/18/15 [place] 18     Microbiology: Recent Results (from the past 720 hour(s))  Surgical pcr screen     Status: None   Collection Time: 01/06/15  9:32 AM  Result Value Ref Range Status   MRSA, PCR NEGATIVE NEGATIVE Final   Staphylococcus aureus NEGATIVE NEGATIVE Final    Comment:        The Xpert SA Assay (FDA approved for NASAL specimens in patients over 74 years of age), is one component of a comprehensive surveillance program.  Test performance has been validated by [facility] for patients greater than or equal to 27 year old. It is not intended to diagnose infection nor to guide or monitor treatment.   Culture, blood (x 2)     Status: None (Preliminary result)   Collection Time: 01/15/15  5:00 PM  Result Value Ref Range Status   Specimen Description BLOOD CENTRAL LINE  Final   Special Requests BOTTLES DRAWN AEROBIC AND ANAEROBIC  6CC EACH  Final   Culture NO GROWTH 3 DAYS  Final   Report Status PENDING  Incomplete  MRSA PCR Screening     Status: None   Collection Time: 01/15/15  5:11 PM  Result Value Ref Range Status   MRSA  by PCR NEGATIVE NEGATIVE Final    Comment:        The GeneXpert MRSA Assay (FDA approved for NASAL specimens only), is one component of a comprehensive MRSA colonization surveillance program. It is not intended to diagnose MRSA infection nor to guide or monitor treatment for MRSA infections.   Culture, blood (x 2)     Status: None (Preliminary result)   Collection Time: 01/15/15  5:15 PM  Result Value Ref Range Status   Specimen Description BLOOD LEFT ARM  Final   Special Requests BOTTLES DRAWN AEROBIC ONLY  6CC  Final   Culture NO GROWTH 3 DAYS  Final   Report Status PENDING  Incomplete    Anti-infectives    Start     Dose/Rate Route Frequency Ordered Stop   01/15/15 1700  vancomycin (VANCOCIN) IVPB 750 mg/150 ml premix     750 mg 150 mL/hr over 60 Minutes Intravenous Every 12 hours 01/15/15 1612     01/15/15 1700  piperacillin-tazobactam (ZOSYN) IVPB 3.375 g     3.375 g 12.5 mL/hr over 240 Minutes Intravenous Every 8 hours 01/15/15 1612     01/15/15 1615  piperacillin-tazobactam (ZOSYN) IVPB 3.375 g  Status:  Discontinued     3.375 g 100 mL/hr over 30 Minutes Intravenous  Once 01/15/15 1602 01/15/15 1612   01/15/15 1615  vancomycin (VANCOCIN) IVPB 1000 mg/200 mL premix  Status:  Discontinued     1,000 mg 200 mL/hr over 60 Minutes Intravenous  Once 01/15/15 1602 01/15/15 1610   01/14/15 1445  ceFAZolin (ANCEF) IVPB 1 g/50 mL premix     1 g 100 mL/hr over 30 Minutes Intravenous Every 8 hours 01/14/15 1438 01/14/15 2316   01/14/15 1258  vancomycin (VANCOCIN) powder  Status:  Discontinued       As needed 01/14/15 1258 01/14/15 1330   01/14/15 1256  vancomycin (VANCOCIN) 1000 MG powder    Comments:  Atilano Median   : cabinet override      01/14/15 1256 01/15/15 0059   01/14/15 0645  ceFAZolin (ANCEF) IVPB 2 g/50 mL premix     2 g 100 mL/hr over 30 Minutes Intravenous On call to O.R. 01/14/15 0642 01/14/15 0840   01/14/15 0557  ceFAZolin (ANCEF) 2-3 GM-% IVPB SOLR     Comments:  Scronce, Trina   : cabinet override      01/14/15 0557 01/14/15 1759      Assessment: 64 yo female with r/o sepsis is currently on therapeutic vancomycin.  Vancomycin trough is 18.   Goal of Therapy:  Vancomycin trough level 15-20 mcg/ml  Plan:  - continue vancomycin 750 mg iv q12h - f/u plan on vancomycin - monitor renal function closely  Abisai Coble, Tsz-Yin 01/18/2015,6:10 PM

## 2015-01-19 LAB — GLUCOSE, CAPILLARY
GLUCOSE-CAPILLARY: 170 mg/dL — AB (ref 65–99)
Glucose-Capillary: 136 mg/dL — ABNORMAL HIGH (ref 65–99)

## 2015-01-19 MED ORDER — ENSURE ENLIVE PO LIQD: 237.0000 mL | Freq: Two times a day (BID) | ORAL | Status: DC

## 2015-01-19 MED ORDER — CARVEDILOL 3.125 MG PO TABS
3.1250 mg | ORAL_TABLET | Freq: Two times a day (BID) | ORAL | Status: DC
  Administered 2015-01-19 – 2015-01-20 (×3): 3.125 mg via ORAL
  Filled 2015-01-19 (×4): qty 1

## 2015-01-19 MED ORDER — BENAZEPRIL HCL 20 MG PO TABS
20.0000 mg | ORAL_TABLET | Freq: Two times a day (BID) | ORAL | Status: DC
  Administered 2015-01-19 – 2015-01-23 (×8): 20 mg via ORAL
  Filled 2015-01-19 (×9): qty 1

## 2015-01-19 MED ORDER — AMLODIPINE BESYLATE 10 MG PO TABS
10.0000 mg | ORAL_TABLET | Freq: Every day | ORAL | Status: DC
  Administered 2015-01-19 – 2015-01-23 (×5): 10 mg via ORAL
  Filled 2015-01-19 (×5): qty 1

## 2015-01-19 MED ORDER — AMLODIPINE BESYLATE 5 MG PO TABS: 5.0000 mg | ORAL_TABLET | Freq: Every day | ORAL | Status: DC

## 2015-01-19 MED ORDER — POTASSIUM CHLORIDE CRYS ER 20 MEQ PO TBCR
40.0000 meq | EXTENDED_RELEASE_TABLET | Freq: Once | ORAL | Status: AC
  Administered 2015-01-19: 40 meq via ORAL
  Filled 2015-01-19: qty 2

## 2015-01-19 MED ORDER — INSULIN ASPART 100 UNIT/ML ~~LOC~~ SOLN
0.0000 [IU] | Freq: Three times a day (TID) | SUBCUTANEOUS | Status: DC
  Administered 2015-01-19: 1 [IU] via SUBCUTANEOUS
  Administered 2015-01-20: 12:00:00 via SUBCUTANEOUS
  Administered 2015-01-20 (×2): 1 [IU] via SUBCUTANEOUS
  Administered 2015-01-21 (×2): 3 [IU] via SUBCUTANEOUS
  Administered 2015-01-21 – 2015-01-22 (×2): 2 [IU] via SUBCUTANEOUS
  Administered 2015-01-22: 1 [IU] via SUBCUTANEOUS
  Administered 2015-01-22 – 2015-01-23 (×2): 2 [IU] via SUBCUTANEOUS
  Administered 2015-01-23: 1 [IU] via SUBCUTANEOUS

## 2015-01-19 MED ORDER — ENSURE ENLIVE PO LIQD: 237.0000 mL | Freq: Two times a day (BID) | ORAL | Status: DC | PRN

## 2015-01-19 MED ORDER — INSULIN ASPART 100 UNIT/ML ~~LOC~~ SOLN: 0.0000 [IU] | Freq: Every day | SUBCUTANEOUS | Status: DC

## 2015-01-19 NOTE — Progress Notes (Signed)
Patient ID: Audrey [name], female   DOB: Nov 25, 1950, 64 y.o.   MRN: [id number] Afebril, c/o pain proximally both legs, no weakness. Dressing dry. Plan as per hospitalists as contiuation of ABS and foley. Rehab to see

## 2015-01-19 NOTE — Progress Notes (Signed)
Patient arrived to room from 3S. Safety precautions and orders reviewed with patient. Right PIV removed r/t infiltration. Right arm elevated. Per patient foley was insert prior to surgery. Per 3S RN, MD order not to take the foley out and possible d/c with foley catheter. Will reevaluate need for foley in AM. Patient appears in no distress at this time. Will continue to monitor.   Ave Filter, RN

## 2015-01-19 NOTE — Progress Notes (Signed)
UR COMPLETED  

## 2015-01-19 NOTE — Progress Notes (Signed)
Spoke to MD [facility] about patients foley catheter. Per MD keep catheter due to  urinary retention. Will continue to monitor.

## 2015-01-19 NOTE — Progress Notes (Signed)
Called daughter and left message to inform her about pt transfer to [place].

## 2015-01-19 NOTE — Consult Note (Signed)
[place] [place] TEAM 1 - Stepdown/ICU TEAM CONSULT F/U NOTE  Audrey [name] YWV:[id number] DOB: [birth year] DOA: 01/14/2015 PCP: [name], [name], MD   Requesting physician: Dr. Joya Salm Date of consultation: 01/15/2015 Reason for consultation: HTN, lethargy  HPI:  64 yo F with history of HTN, degenerative disk disease, admitted to the Neurosurgical Service with L5-S1 stenosis, chronic radiculopathy, neurogenic claudication and is s/p L4-L5 fusion this hospitalization. Throughout the day 01/15/15 patient had been increasingly hypertensive and per RN more lethargic.  BP had been in the 854O systolic for 1-2 hours. TRH asked for consult for hypertension.   Subjective: The pt is alert but somewhat confused.  She c/o mild generalized HA.  No sob, f/c, abdom pain, n/v.    Impression/Recommendations:  Hypertensive urgency - Uncontrolled HTN BP remains very poorly controlled - adjust tx further and follow  Modestly elevated temp Pt has not actually had a temp >101 - no other evidence at this time to suggestion infection - d/c abx and follow clinically   DM Check CBGs - A1c 7.2 - not on meds at home   Hyponatremia -could be from her HCTZ itself - lift fluid restriction - follow Na trend - avoid thiazides   Hypokalemia  -replace and follow - check Mg in AM   Chronic Renal failure -Patient's last creatinine 1.17 back in 03/19/2012 -At baseline  Lethargy -relapsing - avoid sedatives as able   Hallucinations -Resolved   Antibiotics: Zosyn 7/7 > 7/10 Vancomycin 7/7 > 7/10  DVT prophylaxis: SCD  Past Medical History  Diagnosis Date  . PONV (postoperative nausea and vomiting)   . Hypertension   . Headache(784.0)   . Arthritis   . Heart murmur     was told this earlier..did an echo 1-1.5 yrs ago.  ?? the cardio   . Diabetes mellitus     dx early mid 2000.  Type 2    Not taking metformin....   Past Surgical History  Procedure Laterality Date  . Cervical fusion      2 yrs ago   at  [facility]  . Carpal tunnel release      right hand  . Abdominal hysterectomy    . Breast biopsy      right  . Cholecystectomy     Social History:  reports that she quit smoking about 34 years ago. She does not have any smokeless tobacco history on file. She reports that she does not drink alcohol or use illicit drugs.  No Known Allergies History reviewed. No pertinent family history.  Physical Exam: Blood pressure 171/108, pulse 70, temperature 98 F (36.7 C), temperature source Oral, resp. rate 19, height 5\' 2"  (1.575 m), weight 93.2 kg (205 lb 7.5 oz), SpO2 98 %.   General: No acute respiratory distress Lungs: Clear to auscultation bilaterally without wheezes or crackles Cardiovascular: Regular rate and rhythm without murmur gallop or rub normal S1 and S2 Abdomen: Nontender, nondistended, soft, bowel sounds positive, no rebound, no ascites, no appreciable mass Extremities: No significant cyanosis, clubbing, or edema bilateral lower extremities  CBG:  Recent Labs Lab 01/14/15 1337 01/14/15 1628 01/14/15 2157 01/15/15 0350 01/15/15 0642  GLUCAP 159* 195* 156* 169* 174*   BMET and CBC reviewed by MD   Time spent:  35 minutes   Cherene Altes, MD [facility] For Consults/Admissions - Flow Manager [phone] Office  [phone]  Contact MD directly via text page:      [email or web address]      password [facility]  01/19/2015, 3:21 PM

## 2015-01-19 NOTE — Plan of Care (Signed)
Problem: Consults Goal: Diagnosis - Spinal Surgery Outcome: Completed/Met Date Met:  01/19/15 Thoraco/Lumbar Spine Fusion

## 2015-01-19 NOTE — Progress Notes (Signed)
Occupational Therapy Treatment Patient Details Name: Audrey [name] MRN: [id number] DOB: October 09, 1950 Today's Date: 01/19/2015    History of present illness 64 y.o. female s/p posterior L5-S1 fusion.    OT comments  OT session focused on education as unable to progress pt to OOB due to BP 185/148.   Pt requires min verbal cues for back precautions and had delayed processing time.   She reports she has 24 hour assist at discharge, so D/C recommendation updated to home with 24 hour assist.   May benefit from AE instruction   Follow Up Recommendations  Home health OT;Supervision/Assistance - 24 hour    Equipment Recommendations  3 in 1 bedside comode    Recommendations for Other Services      Precautions / Restrictions Precautions Precautions: Back Precaution Booklet Issued: Yes (comment) Precaution Comments: Pt requires min verbal cues to recall back precautions Required Braces or Orthoses: Spinal Brace Spinal Brace: Lumbar corset;Applied in sitting position Restrictions Weight Bearing Restrictions: No       Mobility Bed Mobility Overal bed mobility: Needs Assistance Bed Mobility: Rolling Rolling: Min guard         General bed mobility comments: Pt requires min verbal cues for technique.  Reviewed log rolling with pt `  Transfers Overall transfer level: Needs assistance Equipment used: Rolling walker (2 wheeled) Transfers: Sit to/from Stand Sit to Stand: Min guard         General transfer comment: unable due to elevated BP 185/148 - RN aware     Balance     Sitting balance-Leahy Scale: Fair       Standing balance-Leahy Scale: Fair                     ADL                                         General ADL Comments: reviewed back precautions with pt.  She was instructed in safe method for LB ADLs, but she is unable to cross ankels over knees - may benefit from AE.  Unable to practice ADLs due to elevated BP      Vision                      Perception     Praxis      Cognition   Behavior During Therapy: Flat affect Overall Cognitive Status: Impaired/Different from baseline Area of Impairment: Attention;Following commands;Safety/judgement;Awareness;Problem solving   Current Attention Level: Sustained Memory: Decreased recall of precautions  Following Commands: Follows one step commands consistently;Follows one step commands with increased time Safety/Judgement: Decreased awareness of safety;Decreased awareness of deficits     General Comments: Pt slow to respond and with decreased carry over of info    Extremity/Trunk Assessment               Exercises     Shoulder Instructions       General Comments      Pertinent Vitals/ Pain       Pain Assessment: 0-10 Pain Score: 7  Pain Location: abdomen and buttocks Pain Descriptors / Indicators: Aching;Constant Pain Intervention(s): Monitored during session;Premedicated before session  Home Living  Prior Functioning/Environment              Frequency Min 2X/week     Progress Toward Goals  OT Goals(current goals can now be found in the care plan section)     Acute Rehab OT Goals Patient Stated Goal: per daughter (to go home) ADL Goals Pt Will Perform Grooming: with supervision;standing Pt Will Perform Lower Body Bathing: with min assist;with adaptive equipment;sit to/from stand Pt Will Perform Upper Body Dressing: with modified independence;sitting Pt Will Transfer to Toilet: with min guard assist;ambulating;bedside commode Pt Will Perform Tub/Shower Transfer: Tub transfer;with min assist;ambulating;rolling walker Additional ADL Goal #1: Pt will verbalize 3/3 precautions as precursor to ADLs  Plan Discharge plan needs to be updated    Co-evaluation                 End of Session     Activity Tolerance Other (comment) (elevated BP)   Patient Left in  bed;with call bell/phone within reach;with bed alarm set   Nurse Communication Mobility status        Time: 1731-1746 OT Time Calculation (min): 15 min  Charges: OT General Charges $OT Visit: 1 Procedure OT Treatments $Self Care/Home Management : 8-22 mins  [name] 01/19/2015, 5:54 PM

## 2015-01-19 NOTE — Progress Notes (Signed)
Physical Therapy Treatment Patient Details Name: Audrey [name] MRN: [id number] DOB: 03-17-1951 Today's Date: 01/19/2015    History of Present Illness 64 y.o. female s/p posterior L5-S1 fusion.     PT Comments    Patient progressing well with mobility. Tolerated increased ambulation at supervision levels with RW. Patient with good compliance with precautions. Will continue to see and progress as tolerated.   Follow Up Recommendations  Home health PT;Supervision/Assistance - 24 hour (If no 24 hour care, NHP recommended.  )     Equipment Recommendations  None recommended by PT    Recommendations for Other Services       Precautions / Restrictions Precautions Precautions: Back Precaution Booklet Issued: Yes (comment) Precaution Comments: handout provided and reviewed Required Braces or Orthoses: Spinal Brace Spinal Brace: Lumbar corset;Applied in sitting position Restrictions Weight Bearing Restrictions: No    Mobility  Bed Mobility               General bed mobility comments: recevied in chair  Transfers Overall transfer level: Needs assistance Equipment used: Rolling walker (2 wheeled) Transfers: Sit to/from Stand Sit to Stand: Min guard         General transfer comment: Patient endorses muscle spasms in low back buttocks during transitional movements  Ambulation/Gait Ambulation/Gait assistance: Supervision;Min guard Ambulation Distance (Feet): 380 Feet Assistive device: Rolling walker (2 wheeled) Gait Pattern/deviations: Decreased stride length;Decreased step length - right;Decreased step length - left;Step-to pattern Gait velocity: decreased Gait velocity interpretation: Below normal speed for age/gender General Gait Details: Pt ambulated with RW with slow cadence and fair overall stability.  Cues needed to steer RW as she steered left.     Stairs            Wheelchair Mobility    Modified Rankin (Stroke Patients Only)       Balance      Sitting balance-Leahy Scale: Fair       Standing balance-Leahy Scale: Fair                      Cognition Arousal/Alertness: Awake/alert Behavior During Therapy: Flat affect Overall Cognitive Status: Within Functional Limits for tasks assessed                      Exercises      General Comments        Pertinent Vitals/Pain Pain Assessment: 0-10 Pain Score: 6  Pain Location: back and buttocks Pain Descriptors / Indicators: Aching;Discomfort;Spasm Pain Intervention(s): Monitored during session;Repositioned    Home Living                      Prior Function            PT Goals (current goals can now be found in the care plan section) Acute Rehab PT Goals Patient Stated Goal: per daughter (to go home) PT Goal Formulation: With patient/family Time For Goal Achievement: 01/29/15 Potential to Achieve Goals: Good Progress towards PT goals: Progressing toward goals    Frequency  Min 3X/week    PT Plan Discharge plan needs to be updated;Frequency needs to be updated    Co-evaluation             End of Session Equipment Utilized During Treatment: Gait belt Activity Tolerance: Patient limited by fatigue Patient left: in chair;with call bell/phone within reach;with nursing/sitter in room     Time: 1201-1218 PT Time Calculation (min) (ACUTE ONLY): 17 min  Charges:  $  Gait Training: 8-22 mins                    G CodesDuncan Dull 2015-01-26, 2:14 PM Alben Deeds, Audrey [name]  [phone]

## 2015-01-19 NOTE — Progress Notes (Signed)
Notified MD of pts blood pressure. Awaiting orders. Will continue to monitor.

## 2015-01-19 NOTE — Progress Notes (Signed)
Physical medicine rehabilitation consult requested chart reviewed. Patient ambulating 300 feet minimal guard with rolling walker and recommendations have been made by physical therapy since initial therapy evaluation 01/15/2015 for home health therapies. Will hold on formal rehabilitation consult at this time with recommendations of home health therapies.

## 2015-01-19 NOTE — Progress Notes (Signed)
Nutrition Brief Note  Patient identified on the Malnutrition Screening Tool (MST) Report.  Wt Readings from Last 15 Encounters:  01/15/15 205 lb 7.5 oz (93.2 kg)  01/06/15 194 lb 8 oz (88.225 kg)  03/31/12 185 lb 6.5 oz (84.1 kg)  03/19/12 181 lb 6 oz (82.271 kg)    Body mass index is 37.57 kg/(m^2). Patient meets criteria for Obesity Class II based on current BMI.   Current diet order is Heart Healthy/Carbohydrate Modified, patient is consuming approximately 100% of meals at this time. Labs and medications reviewed.   No nutrition interventions warranted at this time. If nutrition issues arise, please consult RD.   Arthur Holms, RD, LDN Pager #: [phone] After-Hours Pager #: [phone]

## 2015-01-20 DIAGNOSIS — M5136 Other intervertebral disc degeneration, lumbar region: Secondary | ICD-10-CM

## 2015-01-20 LAB — BASIC METABOLIC PANEL
Anion gap: 10 (ref 5–15)
BUN: 12 mg/dL (ref 6–20)
CHLORIDE: 95 mmol/L — AB (ref 101–111)
CO2: 30 mmol/L (ref 22–32)
Calcium: 9.6 mg/dL (ref 8.9–10.3)
Creatinine, Ser: 1.16 mg/dL — ABNORMAL HIGH (ref 0.44–1.00)
GFR calc non Af Amer: 49 mL/min — ABNORMAL LOW (ref >60)
GFR, EST AFRICAN AMERICAN: 57 mL/min — AB (ref >60)
GLUCOSE: 136 mg/dL — AB (ref 65–99)
POTASSIUM: 3.7 mmol/L (ref 3.5–5.1)
SODIUM: 135 mmol/L (ref 135–145)

## 2015-01-20 LAB — CULTURE, BLOOD (ROUTINE X 2)
Culture: NO GROWTH
Culture: NO GROWTH

## 2015-01-20 LAB — GLUCOSE, CAPILLARY
GLUCOSE-CAPILLARY: 148 mg/dL — AB (ref 65–99)
GLUCOSE-CAPILLARY: 184 mg/dL — AB (ref 65–99)
Glucose-Capillary: 136 mg/dL — ABNORMAL HIGH (ref 65–99)
Glucose-Capillary: 180 mg/dL — ABNORMAL HIGH (ref 65–99)

## 2015-01-20 LAB — MAGNESIUM: MAGNESIUM: 1.6 mg/dL — AB (ref 1.7–2.4)

## 2015-01-20 MED ORDER — MAGNESIUM OXIDE 400 (241.3 MG) MG PO TABS
400.0000 mg | ORAL_TABLET | Freq: Three times a day (TID) | ORAL | Status: DC
  Administered 2015-01-20 – 2015-01-23 (×9): 400 mg via ORAL
  Filled 2015-01-20 (×9): qty 1

## 2015-01-20 MED ORDER — DIAZEPAM 5 MG PO TABS
5.0000 mg | ORAL_TABLET | Freq: Four times a day (QID) | ORAL | Status: DC | PRN
  Administered 2015-01-21 (×2): 5 mg via ORAL
  Filled 2015-01-20 (×2): qty 1

## 2015-01-20 MED ORDER — KETOROLAC TROMETHAMINE 30 MG/ML IJ SOLN
30.0000 mg | Freq: Three times a day (TID) | INTRAMUSCULAR | Status: DC | PRN
  Filled 2015-01-20: qty 1

## 2015-01-20 MED ORDER — MAGNESIUM SULFATE 2 GM/50ML IV SOLN
2.0000 g | Freq: Once | INTRAVENOUS | Status: DC
  Filled 2015-01-20: qty 50

## 2015-01-20 MED ORDER — POTASSIUM CHLORIDE CRYS ER 20 MEQ PO TBCR
40.0000 meq | EXTENDED_RELEASE_TABLET | Freq: Once | ORAL | Status: AC
  Administered 2015-01-20: 40 meq via ORAL
  Filled 2015-01-20: qty 2

## 2015-01-20 MED ORDER — HYDROCODONE-ACETAMINOPHEN 5-325 MG PO TABS
1.0000 | ORAL_TABLET | ORAL | Status: DC | PRN
  Administered 2015-01-20: 2 via ORAL
  Administered 2015-01-20: 1 via ORAL
  Administered 2015-01-21 – 2015-01-22 (×4): 2 via ORAL
  Administered 2015-01-23: 1 via ORAL
  Filled 2015-01-20 (×2): qty 2
  Filled 2015-01-20: qty 1
  Filled 2015-01-20 (×3): qty 2
  Filled 2015-01-20: qty 1

## 2015-01-20 NOTE — Progress Notes (Signed)
Physical Therapy Treatment Patient Details Name: Audrey [name] MRN: [id number] DOB: [birth year] Today's Date: 01/20/2015    History of Present Illness 64 y.o. female s/p posterior L5-S1 fusion.     PT Comments    Patient seen for ambulation and mobility. Patient mobilizing well but having more difficulty and required increased time to perform transfers secondary to pain control issues. Patient reports that she is in much more pain today then previous session, states that the spasms are moving into her legs and effecting her ability to transfer. Educated patient regarding continued mobility and encouraged conversation with MD regarding pain management. Patient was able to perform stair negotiation with significantly increased time secondary to pain.  Will continue to see and progress as tolerated.    Follow Up Recommendations  Home health PT;Supervision/Assistance - 24 hour     Equipment Recommendations  None recommended by PT    Recommendations for Other Services       Precautions / Restrictions Precautions Precautions: Back Precaution Booklet Issued: Yes (comment) Precaution Comments: handout provided and reviewed Required Braces or Orthoses: Spinal Brace Spinal Brace: Lumbar corset;Applied in sitting position Restrictions Weight Bearing Restrictions: No    Mobility  Bed Mobility               General bed mobility comments: recevied in chair  Transfers Overall transfer level: Needs assistance Equipment used: Rolling walker (2 wheeled) Transfers: Sit to/from Stand Sit to Stand: Supervision         General transfer comment: patient continues to have increased pain and spasms during transitional movements  Ambulation/Gait Ambulation/Gait assistance: Supervision Ambulation Distance (Feet): 240 Feet Assistive device: Rolling walker (2 wheeled) Gait Pattern/deviations: Decreased stride length;Decreased step length - right;Decreased step length - left;Step-to  pattern Gait velocity: decreased Gait velocity interpretation: Below normal speed for age/gender General Gait Details: Patient with slower cadence this session compared to previous session. patient with increased pain reports   Stairs Stairs: Yes Stairs assistance: Min guard Stair Management: Two rails;Step to pattern;Forwards Number of Stairs: 5 General stair comments: performed 5 steps with min guard assist, increased time to perform and VCs for sequencing secondary to pain  Wheelchair Mobility    Modified Rankin (Stroke Patients Only)       Balance     Sitting balance-Leahy Scale: Fair     Standing balance support: Bilateral upper extremity supported Standing balance-Leahy Scale: Fair                      Cognition Arousal/Alertness: Awake/alert Behavior During Therapy: Flat affect Overall Cognitive Status: Within Functional Limits for tasks assessed                      Exercises      General Comments        Pertinent Vitals/Pain Pain Assessment: 0-10 Pain Score: 8  Pain Location: back, abdomen and buttocks Pain Descriptors / Indicators: Aching;Constant Pain Intervention(s): Monitored during session    Home Living                      Prior Function            PT Goals (current goals can now be found in the care plan section) Acute Rehab PT Goals Patient Stated Goal: per daughter (to go home) PT Goal Formulation: With patient/family Time For Goal Achievement: 01/29/15 Potential to Achieve Goals: Good Progress towards PT goals: Progressing toward goals  Frequency  Min 3X/week    PT Plan Current plan remains appropriate    Co-evaluation             End of Session Equipment Utilized During Treatment: Gait belt Activity Tolerance: Patient limited by fatigue Patient left: in chair;with call bell/phone within reach;with nursing/sitter in room     Time: 1026-1050 PT Time Calculation (min) (ACUTE ONLY): 24  min  Charges:  $Gait Training: 8-22 mins $Therapeutic Activity: 8-22 mins                    G CodesDuncan Dull 02-09-15, 10:54 AM Alben Deeds, PT DPT  [phone]

## 2015-01-20 NOTE — Progress Notes (Signed)
Patient ID: Audrey [name], female   DOB: [birth year], 64 y.o.   MRN: [id number] BP 157/94 mmHg  Pulse 91  Temp(Src) 98.1 F (36.7 C) (Oral)  Resp 18  Ht 5\' 2"  (1.575 m)  Wt 93.2 kg (205 lb 7.5 oz)  BMI 37.57 kg/m2  SpO2 99% Alert and oriented x 4, speech is clear and fluent Moving all extremities well Wound is clean, dry, no signs of infection Working with physical therapy Internists still calibrating medications for blood pressure

## 2015-01-20 NOTE — Progress Notes (Signed)
IV team consult was placed but the phlebotomist stated that she was unable to start IV due to difficulty to stick. MD notified. IV order changed to po. New order for new pain medication per MD verbal order.

## 2015-01-20 NOTE — Progress Notes (Signed)
Pt has no IV access at this time. Attempted to start IV but pt difficult stick. IV team consult order placed.

## 2015-01-20 NOTE — Progress Notes (Addendum)
CONSULT F/U NOTE  Audrey [name] NWG:[id number] DOB: 07-11-1951 DOA: 01/14/2015 PCP: [name], [name], MD   Requesting physician: Dr. Joya Salm Date of consultation: 01/15/2015 Reason for consultation: HTN, lethargy  HPI:  64 yo F with history of HTN, degenerative disk disease, admitted to the Neurosurgical Service with L5-S1 stenosis, chronic radiculopathy, neurogenic claudication and is s/p L4-L5 fusion this hospitalization. Throughout the day 01/15/15 patient had been increasingly hypertensive and per RN more lethargic.  BP had been in the 578I systolic for 1-2 hours. TR[facility] asked for consult for hypertension.   Subjective: Lethargy resolved, she think high blood pressure is secondary to pain.  Impression/Recommendations:  Hypertensive urgency - Uncontrolled HTN BP remains very poorly controlled, improving control, continue current medications  Modestly elevated temp Pt has not actually had a temp >101 - no other evidence at this time to suggestion infection - d/c abx and follow clinically   DM Check CBGs - A1c 7.2 - not on meds at home  Metformin 1000 mg PO BID on discharge and follow up with PCP.  Hyponatremia -could be from her HCTZ itself - lift fluid restriction - follow Na trend - avoid thiazides. This resolved now.   Hypokalemia, hypomagnesmia  -replace and follow - replete with oral Mag.  Chronic Renal failure -Patient's last creatinine 1.17 back in 03/19/2012 -At baseline  Lethargy -resolved - avoid sedatives as able   Hallucinations -Resolved   Antibiotics: Zosyn 7/7 > 7/10 Vancomycin 7/7 > 7/10  DVT prophylaxis: SCD  Past Medical History  Diagnosis Date  . PONV (postoperative nausea and vomiting)   . Hypertension   . Headache(784.0)   . Arthritis   . Heart murmur     was told this earlier..did an echo 1-1.5 yrs ago.  ?? the cardio   . Diabetes mellitus     dx early mid 2000.  Type 2    Not taking metformin....   Past Surgical History  Procedure  Laterality Date  . Cervical fusion      2 yrs ago   at [facility]  . Carpal tunnel release      right hand  . Abdominal hysterectomy    . Breast biopsy      right  . Cholecystectomy     Social History:  reports that she quit smoking about 34 years ago. She does not have any smokeless tobacco history on file. She reports that she does not drink alcohol or use illicit drugs.  No Known Allergies History reviewed. No pertinent family history.  Physical Exam: Blood pressure 167/87, pulse 84, temperature 98.4 F (36.9 C), temperature source Oral, resp. rate 18, height 5\' 2"  (1.575 m), weight 93.2 kg (205 lb 7.5 oz), SpO2 100 %.   General: No acute respiratory distress Lungs: Clear to auscultation bilaterally without wheezes or crackles Cardiovascular: Regular rate and rhythm without murmur gallop or rub normal S1 and S2 Abdomen: Nontender, nondistended, soft, bowel sounds positive, no rebound, no ascites, no appreciable mass Extremities: No significant cyanosis, clubbing, or edema bilateral lower extremities  CBG:  Recent Labs Lab 01/15/15 0642 01/19/15 1648 01/19/15 2153 01/20/15 0635 01/20/15 1116  GLUCAP 174* 136* 170* 136* 180*   BMET and CBC reviewed by MD   Time spent:  35 minutes   Randell Teare A, MD   [facility] For Consults/Admissions - Flow Manager - [phone] Office  [phone]  Contact MD directly via text page:      [email or web address]      password [facility]  01/20/2015,  1:34 PM

## 2015-01-20 NOTE — Progress Notes (Signed)
Pt reports that pain medication Tramadol 50mg  ordered for pain is not fully effective for her pain back.  Called Md office spoke with nurse Theadora Rama who stated that she will notify Md. No noted distress. Will continue to monitor.

## 2015-01-21 DIAGNOSIS — I10 Essential (primary) hypertension: Secondary | ICD-10-CM

## 2015-01-21 LAB — BASIC METABOLIC PANEL
ANION GAP: 8 (ref 5–15)
BUN: 14 mg/dL (ref 6–20)
CO2: 30 mmol/L (ref 22–32)
Calcium: 9.2 mg/dL (ref 8.9–10.3)
Chloride: 94 mmol/L — ABNORMAL LOW (ref 101–111)
Creatinine, Ser: 1.31 mg/dL — ABNORMAL HIGH (ref 0.44–1.00)
GFR calc Af Amer: 49 mL/min — ABNORMAL LOW (ref >60)
GFR calc non Af Amer: 42 mL/min — ABNORMAL LOW (ref >60)
GLUCOSE: 170 mg/dL — AB (ref 65–99)
Potassium: 3.9 mmol/L (ref 3.5–5.1)
Sodium: 132 mmol/L — ABNORMAL LOW (ref 135–145)

## 2015-01-21 LAB — GLUCOSE, CAPILLARY
GLUCOSE-CAPILLARY: 249 mg/dL — AB (ref 65–99)
GLUCOSE-CAPILLARY: 152 mg/dL — AB (ref 65–99)
Glucose-Capillary: 158 mg/dL — ABNORMAL HIGH (ref 65–99)
Glucose-Capillary: 166 mg/dL — ABNORMAL HIGH (ref 65–99)

## 2015-01-21 LAB — CBC
HEMATOCRIT: 31.8 % — AB (ref 36.0–46.0)
HEMOGLOBIN: 10.5 g/dL — AB (ref 12.0–15.0)
MCH: 28.3 pg (ref 26.0–34.0)
MCHC: 33 g/dL (ref 30.0–36.0)
MCV: 85.7 fL (ref 78.0–100.0)
PLATELETS: 324 10*3/uL (ref 150–400)
RBC: 3.71 MIL/uL — AB (ref 3.87–5.11)
RDW: 13.3 % (ref 11.5–15.5)
WBC: 7.4 10*3/uL (ref 4.0–10.5)

## 2015-01-21 LAB — MAGNESIUM: Magnesium: 1.6 mg/dL — ABNORMAL LOW (ref 1.7–2.4)

## 2015-01-21 MED ORDER — TAMSULOSIN HCL 0.4 MG PO CAPS
0.8000 mg | ORAL_CAPSULE | Freq: Every day | ORAL | Status: DC
  Administered 2015-01-21 – 2015-01-23 (×3): 0.8 mg via ORAL
  Filled 2015-01-21 (×3): qty 2

## 2015-01-21 MED ORDER — SODIUM CHLORIDE 0.9 % IV BOLUS (SEPSIS)
250.0000 mL | Freq: Once | INTRAVENOUS | Status: AC
  Administered 2015-01-21: 250 mL via INTRAVENOUS

## 2015-01-21 MED ORDER — CARVEDILOL 12.5 MG PO TABS
12.5000 mg | ORAL_TABLET | Freq: Two times a day (BID) | ORAL | Status: DC
  Administered 2015-01-21: 12.5 mg via ORAL
  Filled 2015-01-21: qty 1

## 2015-01-21 MED ORDER — CARVEDILOL 6.25 MG PO TABS
6.2500 mg | ORAL_TABLET | Freq: Two times a day (BID) | ORAL | Status: DC
  Administered 2015-01-22 – 2015-01-23 (×3): 6.25 mg via ORAL
  Filled 2015-01-21 (×3): qty 1

## 2015-01-21 MED ORDER — CARVEDILOL 12.5 MG PO TABS: 25.0000 mg | ORAL_TABLET | Freq: Two times a day (BID) | ORAL | Status: DC

## 2015-01-21 NOTE — Progress Notes (Signed)
Pt's manual BP 78/56 while sitting. Dr Conley Canal paged and 250 ml NS bolus ordered. Pt's bp 120/70 after she laid back in the bed. Dr Conley Canal notified and no further orders given. IV team paged to place IV for pt. Will continue to monitor.

## 2015-01-21 NOTE — Progress Notes (Signed)
Patient ID: Audrey [name], female   DOB: [birth year], 64 y.o.   MRN: [id number] Came by to see her . Sleepy. No complains

## 2015-01-21 NOTE — Progress Notes (Signed)
   CONSULT F/U NOTE  SHENICKA SUNDERLIN NLZ:[id number] DOB: May 10, 1951 DOA: 01/14/2015 PCP: [name], [name], MD     Impression/Recommendations:  Hypertensive urgency - Uncontrolled HTN Blood pressure still running high. Increased Coreg to 12-1/2 mg twice a day. Subsequently called by nurse with blood pressure of 77. Repeat was 120 lying down. Will decrease coreg and hold parameters ordered for other antihypertensives. Give small fluid bolus and repeat labs.  DM Check CBGs - A1c 7.2 - not on meds at home  Metformin 1000 mg PO BID on discharge and follow up with PCP.  Hyponatremia Improved off hydrochlorothiazide   Lethargy -resolved - avoid sedatives as able   Hallucinations -Resolved   Antibiotics: Zosyn 7/7 > 7/10 Vancomycin 7/7 > 7/10  DVT prophylaxis: SCD  Past Medical History  Diagnosis Date  . PONV (postoperative nausea and vomiting)   . Hypertension   . Headache(784.0)   . Arthritis   . Heart murmur     was told this earlier..did an echo 1-1.5 yrs ago.  ?? the cardio   . Diabetes mellitus     dx early mid 2000.  Type 2    Not taking metformin....   Past Surgical History  Procedure Laterality Date  . Cervical fusion      2 yrs ago   at [facility]  . Carpal tunnel release      right hand  . Abdominal hysterectomy    . Breast biopsy      right  . Cholecystectomy     Social History:  reports that she quit smoking about 34 years ago. She does not have any smokeless tobacco history on file. She reports that she does not drink alcohol or use illicit drugs.  No Known Allergies History reviewed. No pertinent family history.   subjective: No complaints  Physical Exam: Blood pressure 77/58, pulse 95, temperature 98.2 F (36.8 C), temperature source Oral, resp. rate 18, height 5\' 2"  (1.575 m), weight 93.2 kg (205 lb 7.5 oz), SpO2 100 %.   General: on commode   CBG:  Recent Labs Lab 01/20/15 1640 01/20/15 2201 01/21/15 0821 01/21/15 1144 01/21/15 1720    GLUCAP 148* 184* 249* 158* 166*   BMET and CBC reviewed by MD   Time spent:  25 minutes   Delfina Redwood, MD  [facility]  Contact MD directly via text page:      [email or web address]      password [facility]  01/21/2015, 6:51 PM

## 2015-01-21 NOTE — Progress Notes (Signed)
Patient ID: Audrey [name], female   DOB: Sep 15, 1950, 64 y.o.   MRN: [id number] No fever, wound dry. Pain proximally both legs. No weakness. Foley to be dc

## 2015-01-21 NOTE — Progress Notes (Signed)
Physical Therapy Treatment Patient Details Name: Audrey [name] MRN: [id number] DOB: 1951/04/29 Today's Date: 01/21/2015    History of Present Illness 64 y.o. female s/p posterior L5-S1 fusion.     PT Comments    Patient mobilizing well this session. Modified independent with ambulation and supervision with transfers. No physical assist required throughout session. Anticipate patient will be safe for d/c home with daughter when medically appropriate.   Follow Up Recommendations  Home health PT;Supervision/Assistance - Intermittent     Equipment Recommendations  None recommended by PT    Recommendations for Other Services       Precautions / Restrictions Precautions Precautions: Back Precaution Booklet Issued: Yes (comment) Precaution Comments: handout provided and reviewed Required Braces or Orthoses: Spinal Brace Spinal Brace: Lumbar corset;Applied in sitting position Restrictions Weight Bearing Restrictions: No    Mobility  Bed Mobility               General bed mobility comments: recevied in chair  Transfers Overall transfer level: Needs assistance Equipment used: Rolling walker (2 wheeled)   Sit to Stand: Supervision         General transfer comment: good technique and positioning this session  Ambulation/Gait Ambulation/Gait assistance: Modified independent (Device/Increase time) Ambulation Distance (Feet): 380 Feet Assistive device: Rolling walker (2 wheeled) Gait Pattern/deviations: WFL(Within Functional Limits) Gait velocity: decreased Gait velocity interpretation: Below normal speed for age/gender General Gait Details: Steady with ambulation this session, no assist required   Stairs            Wheelchair Mobility    Modified Rankin (Stroke Patients Only)       Balance   Sitting-balance support: Feet supported Sitting balance-Leahy Scale: Good     Standing balance support: No upper extremity supported Standing  balance-Leahy Scale: Fair Standing balance comment: able to perform hygiene and tasks at sink without assist or support                    Cognition Arousal/Alertness: Awake/alert Behavior During Therapy: Flat affect Overall Cognitive Status: Within Functional Limits for tasks assessed                      Exercises      General Comments        Pertinent Vitals/Pain Pain Assessment: 0-10 Pain Score: 3  Pain Location: back Pain Descriptors / Indicators: Sore Pain Intervention(s): Premedicated before session    Home Living                      Prior Function            PT Goals (current goals can now be found in the care plan section) Acute Rehab PT Goals Patient Stated Goal: per daughter (to go home) PT Goal Formulation: With patient/family Time For Goal Achievement: 01/29/15 Potential to Achieve Goals: Good Progress towards PT goals: Progressing toward goals    Frequency  Min 3X/week    PT Plan Current plan remains appropriate    Co-evaluation             End of Session Equipment Utilized During Treatment: Gait belt Activity Tolerance: Patient limited by fatigue Patient left: in chair;with call bell/phone within reach, chair alarm set    Time: 3149-7026 PT Time Calculation (min) (ACUTE ONLY): 19 min  Charges:  $Gait Training: 8-22 mins  G CodesDuncan Dull February 06, 2015, 2:02 PM Alben Deeds, [place] DPT  [phone]

## 2015-01-21 NOTE — Progress Notes (Signed)
Occupational Therapy Treatment Patient Details Name: Audrey [name] MRN: [id number] DOB: [birth year] Today's Date: 01/21/2015    History of present illness 64 y.o. female s/p posterior L5-S1 fusion.    OT comments  Pt sitting in chair upon arrival, reports she "feels better, but not great". OT session focused on LB dressing with AE. Pt reports having AE at home from past surgery. Pt able to verbalize 2/3 precautions, and demonstrated ability to use AE to complete LB dressing while maintaining precautions. Education and demonstration provided on bed mobility with flat bed/ no rails; and use of 3in1 for safety at home. Pt reports she will have 24/7 assist at home upon d/c.    Follow Up Recommendations  Home health OT;Supervision/Assistance - 24 hour    Equipment Recommendations  3 in 1 bedside comode    Recommendations for Other Services      Precautions / Restrictions Precautions Precautions: Back Precaution Booklet Issued: Yes (comment) Precaution Comments: Pt able to verbalize 2/3 precautions Required Braces or Orthoses: Spinal Brace Spinal Brace: Lumbar corset;Applied in sitting position Restrictions Weight Bearing Restrictions: No       Mobility Bed Mobility               General bed mobility comments: Education provided on bed mobility at home; demonstration with bed flat/ no rails  Transfers Overall transfer level: Needs assistance Equipment used: Rolling walker (2 wheeled) Transfers: Sit to/from Stand Sit to Stand: Supervision              Balance Overall balance assessment: Needs assistance Sitting-balance support: Feet supported       Standing balance support: Single extremity supported;During functional activity                       ADL Overall ADL's : Needs assistance/impaired               Lower Body Bathing Details (indicate cue type and reason): Education provided on LB bathing using AE Upper Body Dressing : Minimal  assistance;Sitting (don brace)   Lower Body Dressing: Minimal assistance;With adaptive equipment;Sit to/from stand (don socks with AE) Lower Body Dressing Details (indicate cue type and reason): Education provided on LB dressing using AE               General ADL Comments: Pt reports having AE at home from past surgery      Vision                     Perception     Praxis      Cognition   Behavior During Therapy: [facility] for tasks assessed/performed Overall Cognitive Status: Within Functional Limits for tasks assessed                       Extremity/Trunk Assessment               Exercises     Shoulder Instructions       General Comments      Pertinent Vitals/ Pain       Pain Assessment: No/denies pain  Home Living                                          Prior Functioning/Environment              Frequency Min 2X/week  Progress Toward Goals  OT Goals(current goals can now be found in the care plan section)  Progress towards OT goals: Progressing toward goals  Acute Rehab OT Goals Patient Stated Goal: to go home OT Goal Formulation: With patient Time For Goal Achievement: 01/29/15 ADL Goals Pt Will Perform Grooming: with supervision;standing Pt Will Perform Lower Body Bathing: with min assist;with adaptive equipment;sit to/from stand Pt Will Perform Upper Body Dressing: with modified independence;sitting Pt Will Transfer to Toilet: with min guard assist;ambulating;bedside commode Pt Will Perform Tub/Shower Transfer: Tub transfer;with min assist;ambulating;rolling walker Additional ADL Goal #1: Pt will verbalize 3/3 precautions as precursor to ADLs  Plan Discharge plan remains appropriate    Co-evaluation                 End of Session Equipment Utilized During Treatment: Gait belt;Rolling walker;Back brace   Activity Tolerance Patient tolerated treatment well;No increased pain   Patient Left  in chair;with call bell/phone within reach;with chair alarm set;with family/visitor present   Nurse Communication Mobility status;Precautions        Time: 1000-1015 OT Time Calculation (min): 15 min  Charges:    Forest Gleason 01/21/2015, 10:28 AM

## 2015-01-22 LAB — GLUCOSE, CAPILLARY
Glucose-Capillary: 166 mg/dL — ABNORMAL HIGH (ref 65–99)
Glucose-Capillary: 177 mg/dL — ABNORMAL HIGH (ref 65–99)
Glucose-Capillary: 146 mg/dL — ABNORMAL HIGH (ref 65–99)

## 2015-01-22 MED ORDER — FLEET ENEMA 7-19 GM/118ML RE ENEM: 1.0000 | ENEMA | Freq: Every day | RECTAL | Status: DC | PRN

## 2015-01-22 MED ORDER — BENAZEPRIL HCL 40 MG PO TABS: 40.0000 mg | ORAL_TABLET | Freq: Every day | ORAL | Status: AC

## 2015-01-22 MED ORDER — METOPROLOL SUCCINATE ER 100 MG PO TB24: 100.0000 mg | ORAL_TABLET | Freq: Every day | ORAL | Status: AC

## 2015-01-22 MED ORDER — METFORMIN HCL 500 MG PO TABS: 500.0000 mg | ORAL_TABLET | Freq: Two times a day (BID) | ORAL | Status: AC

## 2015-01-22 NOTE — Progress Notes (Signed)
   CONSULT F/U NOTE  Audrey [name] WJX:[id number] DOB: 12-10-1950 DOA: 01/14/2015 PCP: [name], Audrey G, MD     Impression/Recommendations:  Brief episode of hypotension Transient and resolved. Patient is alert, oriented, ambulating in the room with assistance. Blood pressure 167/78. As long as blood pressure remains stable, may discharge home from my perspective. Would have patient follow-up with her primary care provider within a week or 2 to monitor. At baseline, runs high. Patient reports usually 140-160.  Would discharge on the following regimen:  Toprol xl 100 mg daily (higher dose) Benazepril 40 mg daily (rx written)  STOP benazepril-HCT  DM Check CBGs - A1c 7.2 - not on meds at home  rx for metformin 500 mg bid written  Hyponatremia Improved off hydrochlorothiazide. Would not resume  Lethargy -resolved - avoid sedatives as able   Hallucinations -Resolved   Antibiotics: Zosyn 7/7 > 7/10 Vancomycin 7/7 > 7/10  DVT prophylaxis: SCD  Past Medical History  Diagnosis Date  . PONV (postoperative nausea and vomiting)   . Hypertension   . Headache(784.0)   . Arthritis   . Heart murmur     was told this earlier..did an echo 1-1.5 yrs ago.  ?? the cardio   . Diabetes mellitus     dx early mid 2000.  Type 2    Not taking metformin....   Past Surgical History  Procedure Laterality Date  . Cervical fusion      2 yrs ago   at [facility]  . Carpal tunnel release      right hand  . Abdominal hysterectomy    . Breast biopsy      right  . Cholecystectomy     Social History:  reports that she quit smoking about 34 years ago. She does not have any smokeless tobacco history on file. She reports that she does not drink alcohol or use illicit drugs.  No Known Allergies History reviewed. No pertinent family history.   subjective: had a small bowel movement after enema yesterday. Some difficulty voiding, but is able to.  Physical Exam: Blood pressure 167/78, pulse 103,  temperature 98.4 F (36.9 C), temperature source Oral, resp. rate 16, height 5\' 2"  (1.575 m), weight 93.2 kg (205 lb 7.5 oz), SpO2 98 %.   General: ambulating in room with assistance. Sitting at side of bed eating breakfast Lungs clear to auscultation bilaterally without wheezes rhonchi or rales Cardiovascular regular rate rhythm without murmurs gallops rubs Abdomen soft nontender nondistended Back: Brace in place Extremities no clubbing cyanosis or edema  CBG:  Recent Labs Lab 01/21/15 0821 01/21/15 1144 01/21/15 1720 01/21/15 2140 01/22/15 0607  GLUCAP 249* 158* 166* 152* 166*   BMET and CBC reviewed by MD   Time spent:  25 minutes   Delfina Redwood, MD  [facility]  Contact MD directly via text page:      [email or web address]      password [facility]  01/22/2015, 8:11 AM

## 2015-01-22 NOTE — Care Management Note (Signed)
Case Management Note  Patient Details  Name: Audrey [name] MRN: [id number] Date of Birth: 1950-12-03  Subjective/Objective:                    Action/Plan Patient requesting [facility] for [facility]. Spoke with [place] with [facility], who is able to accept the patient and will be up to see her later today.  Miranda with [facility] was notified that patient has decided to use a different agency. Plan is for discharge home 01/23/15 with HHPT/OT/RN for blood pressure management. Expected Discharge Date:                  Expected Discharge Plan:  [place]  In-House Referral:     Discharge planning Services  CM Consult  Post Acute Care Choice:  Home Health Choice offered to:  Patient  DME Arranged:    DME Agency:  [place] Arranged:  PT, OT, RN [facility] Agency:  [place]  Status of Service:  Completed, signed off  Medicare Important Message Given:    Date Medicare IM Given:    Medicare IM give by:    Date Additional Medicare IM Given:    Additional Medicare Important Message give by:     If discussed at [place] of Stay Meetings, dates discussed:    Additional Comments:  Rolm Baptise, RN 01/22/2015, 11:15 AM

## 2015-01-22 NOTE — Progress Notes (Signed)
Met with patient to discuss discharge planning.  HH orders were placed this morning per the request of Drs Joya Salm and [place].  Patient states that she is interested in using [facility] for HHPT/OT/RN.  She is aware that she had been set up with [facility] by the CM on her previous unit.  CM left a message for Karolee Stamps with [facility] to see if they are able to take the patient.  Patient would like [facility] as her second choice. CM will update both [place] agencies and patient once call to [facility] has been returned.

## 2015-01-22 NOTE — Progress Notes (Signed)
Patient ID: Audrey [name], female   DOB: August 02, 1950, 64 y.o.   MRN: [id number] More awake, wound dry. Unable to void. Constipated. Needs more mobility before she goes home

## 2015-01-23 LAB — GLUCOSE, CAPILLARY
GLUCOSE-CAPILLARY: 137 mg/dL — AB (ref 65–99)
GLUCOSE-CAPILLARY: 137 mg/dL — AB (ref 65–99)
GLUCOSE-CAPILLARY: 175 mg/dL — AB (ref 65–99)

## 2015-01-23 NOTE — Progress Notes (Signed)
Pt discharging with her daughter home taking all personal belongings. IV discontinued, dry dressing applied. Surgical staple site, clean, dry and intact. No drainage. Discharge instructions and prescriptions provided with verbal understanding. Pt made aware of follow up appts that need to be scheduled. Pt to receive PT, OT, and aide. No assistive device needed pt states she has equipment for needs at home.

## 2015-01-23 NOTE — Progress Notes (Signed)
Pt ambulated to the bathroom with this nurse stand by assist. Gait steady with rolling walker and brace. She denies pain or discomfort. No noted distress. Daughter at chair side. Will continue to monitor.

## 2015-01-23 NOTE — Discharge Summary (Signed)
Physician Discharge Summary  Patient ID: SHERITA DECOSTE MRN: [id number] DOB/AGE: 64-24-1952 64 y.o.  Admit date: 01/14/2015 Discharge date: 01/23/2015  Admission Diagnoses:lumbar degenerative disc disease  Discharge Diagnoses:  Active Problems:   Degenerative disc disease, lumbar   Hypertensive urgency   Hyponatremia   Chronic renal failure   Lethargy   Hallucinations, visual   SIADH (syndrome of inappropriate ADH production)   Discharged Condition: awake less pain  Hospital Course: surgery, confusion post op. Low sodium  Consults:hospitalist  Significant Diagnostic Studies: mri, ct chest angio  Treatments: lumbar fusion. Arterial hypertension care  Discharge Exam: Blood pressure 128/61, pulse 92, temperature 97.7 F (36.5 C), temperature source Oral, resp. rate 18, height 5\' 2"  (1.575 m), weight 93.2 kg (205 lb 7.5 oz), SpO2 99 %. Wound dry. No weakness  Disposition: 06-Home-Health Care Svc     Medication List    STOP taking these medications        benazepril-hydrochlorthiazide 20-12.5 MG per tablet  Commonly known as:  LOTENSIN HCT      TAKE these medications        benazepril 40 MG tablet  Commonly known as:  LOTENSIN  Take 1 tablet (40 mg total) by mouth daily.     metFORMIN 500 MG tablet  Commonly known as:  GLUCOPHAGE  Take 1 tablet (500 mg total) by mouth 2 (two) times daily with a meal. For diabetes     metoprolol succinate 100 MG 24 hr tablet  Commonly known as:  TOPROL-XL  Take 1 tablet (100 mg total) by mouth daily.      ASK your doctor about these medications        acetaminophen 650 MG CR tablet  Commonly known as:  TYLENOL  Take 650 mg by mouth every 8 (eight) hours as needed for pain.     aspirin 81 MG chewable tablet  Chew 81 mg by mouth.     Biotin 2500 MCG Caps  Take 2,500 mcg by mouth daily.     Calcium-Magnesium-Zinc 167-83-8 MG Tabs  Take 1 tablet by mouth 2 (two) times daily.     cetirizine 10 MG tablet  Commonly  known as:  ZYRTEC  Take 10 mg by mouth daily as needed for allergies.     MAGNESIA PO  Take 250 mg by mouth.     mometasone 50 MCG/ACT nasal spray  Commonly known as:  NASONEX  Place 2 sprays into the nose daily as needed (allergies).     multivitamin with minerals Tabs tablet  Take 1 tablet by mouth daily.     Potassium Gluconate 595 MG Caps  Take 1 capsule by mouth daily.     Vitamin D-3 5000 UNITS Tabs  Take 1 tablet by mouth daily.           Follow-up Information    Follow up with [facility].   Why:  Home Health PT arranged   Contact information:   [address] [place]  [address] [phone]       Follow up with [name], [name], MD.   Specialty:  Family Medicine   Contact information:   [place] [address] [phone]       Signed: Floyce Stakes 01/23/2015, 12:35 PM

## 2015-01-23 NOTE — Progress Notes (Signed)
Physical Therapy Treatment Patient Details Name: Audrey [name] MRN: [id number] DOB: 09-05-1950 Today's Date: 2015/01/29    History of Present Illness 64 y.o. female s/p posterior L5-S1 fusion.     PT Comments    Patient mobilizing well. Performed stair negotiation without assist, ambulated with AND without RW increased distance. Reviewed precautions with good carry over. Anticipate patient will be safe for d/c home.  Follow Up Recommendations  Home health PT;Supervision/Assistance - 24 hour     Equipment Recommendations  None recommended by PT    Recommendations for Other Services       Precautions / Restrictions Precautions Precautions: Back Precaution Booklet Issued: Yes (comment) Precaution Comments: handout provided and reviewed Required Braces or Orthoses: Spinal Brace Spinal Brace: Lumbar corset;Applied in sitting position Restrictions Weight Bearing Restrictions: No    Mobility  Bed Mobility               General bed mobility comments: recevied in chair  Transfers Overall transfer level: Needs assistance Equipment used: Rolling walker (2 wheeled)   Sit to Stand: Supervision         General transfer comment: good technique and positioning this session  Ambulation/Gait Ambulation/Gait assistance: Modified independent (Device/Increase time) Ambulation Distance (Feet): 400 Feet Assistive device: Rolling walker (2 wheeled) (200 without device)   Gait velocity: decreased   General Gait Details: Steady with ambulation this session, no assist required   Stairs Stairs: Yes Stairs assistance: Supervision Stair Management: One rail Right;Step to pattern Number of Stairs: 6 General stair comments: no physical assist required  Wheelchair Mobility    Modified Rankin (Stroke Patients Only)       Balance     Sitting balance-Leahy Scale: Good       Standing balance-Leahy Scale: Fair                      Cognition  Arousal/Alertness: Awake/alert Behavior During Therapy: Flat affect Overall Cognitive Status: Within Functional Limits for tasks assessed                      Exercises      General Comments        Pertinent Vitals/Pain Pain Assessment: No/denies pain    Home Living                      Prior Function            PT Goals (current goals can now be found in the care plan section) Acute Rehab PT Goals Patient Stated Goal: per daughter (to go home) PT Goal Formulation: With patient/family Time For Goal Achievement: 01/29/15 Potential to Achieve Goals: Good Progress towards PT goals: Progressing toward goals    Frequency  Min 3X/week    PT Plan Current plan remains appropriate    Co-evaluation             End of Session Equipment Utilized During Treatment: Gait belt Activity Tolerance: Patient limited by fatigue Patient left: in chair;with call bell/phone within reach;with nursing/sitter in room     Time: 0915-0934 PT Time Calculation (min) (ACUTE ONLY): 19 min  Charges:  $Gait Training: 8-22 mins                    G CodesDuncan Dull 2015/01/29, 10:42 AM Alben Deeds, PT DPT  [phone]

## 2015-01-30 ENCOUNTER — Other Ambulatory Visit (HOSPITAL_COMMUNITY)
Admission: RE | Admit: 2015-01-30 | Discharge: 2015-01-30 | Disposition: A | Discharge status: Home / Self Care | Payer: BLUE CROSS/BLUE SHIELD | Source: Other Acute Inpatient Hospital | Attending: Family Medicine | Admitting: Family Medicine

## 2015-01-30 DIAGNOSIS — I973 Postprocedural hypertension: Secondary | ICD-10-CM | POA: Diagnosis present

## 2015-01-30 LAB — URINALYSIS, ROUTINE W REFLEX MICROSCOPIC
Bilirubin Urine: NEGATIVE
GLUCOSE, UA: NEGATIVE mg/dL
Hgb urine dipstick: NEGATIVE
Ketones, ur: NEGATIVE mg/dL
LEUKOCYTES UA: NEGATIVE
Nitrite: NEGATIVE
PH: 5.5 (ref 5.0–8.0)
PROTEIN: NEGATIVE mg/dL
SPECIFIC GRAVITY, URINE: 1.025 (ref 1.005–1.030)
UROBILINOGEN UA: 0.2 mg/dL (ref 0.0–1.0)

## 2015-02-01 LAB — URINE CULTURE: Culture: NO GROWTH

## 2015-08-11 ENCOUNTER — Ambulatory Visit (INDEPENDENT_AMBULATORY_CARE_PROVIDER_SITE_OTHER): Payer: BLUE CROSS/BLUE SHIELD | Admitting: Urology

## 2015-08-11 DIAGNOSIS — R3129 Other microscopic hematuria: Secondary | ICD-10-CM

## 2016-01-13 DIAGNOSIS — R109 Unspecified abdominal pain: Secondary | ICD-10-CM | POA: Diagnosis not present

## 2016-01-13 DIAGNOSIS — R3129 Other microscopic hematuria: Secondary | ICD-10-CM | POA: Diagnosis not present

## 2016-01-21 DIAGNOSIS — R109 Unspecified abdominal pain: Secondary | ICD-10-CM | POA: Diagnosis not present

## 2016-01-22 DIAGNOSIS — Z90710 Acquired absence of both cervix and uterus: Secondary | ICD-10-CM | POA: Diagnosis not present

## 2016-01-22 DIAGNOSIS — R109 Unspecified abdominal pain: Secondary | ICD-10-CM | POA: Diagnosis not present

## 2016-01-22 DIAGNOSIS — R1031 Right lower quadrant pain: Secondary | ICD-10-CM | POA: Diagnosis not present

## 2016-01-22 DIAGNOSIS — Z9889 Other specified postprocedural states: Secondary | ICD-10-CM | POA: Diagnosis not present

## 2016-01-22 DIAGNOSIS — Z9049 Acquired absence of other specified parts of digestive tract: Secondary | ICD-10-CM | POA: Diagnosis not present

## 2016-03-17 DIAGNOSIS — I10 Essential (primary) hypertension: Secondary | ICD-10-CM | POA: Diagnosis not present

## 2016-03-17 DIAGNOSIS — M545 Low back pain: Secondary | ICD-10-CM | POA: Diagnosis not present

## 2016-03-17 DIAGNOSIS — Z6838 Body mass index (BMI) 38.0-38.9, adult: Secondary | ICD-10-CM | POA: Diagnosis not present

## 2016-04-06 DIAGNOSIS — Z1211 Encounter for screening for malignant neoplasm of colon: Secondary | ICD-10-CM | POA: Diagnosis not present

## 2016-04-06 DIAGNOSIS — E1122 Type 2 diabetes mellitus with diabetic chronic kidney disease: Secondary | ICD-10-CM | POA: Diagnosis not present

## 2016-04-06 DIAGNOSIS — Z23 Encounter for immunization: Secondary | ICD-10-CM | POA: Diagnosis not present

## 2016-04-06 DIAGNOSIS — I10 Essential (primary) hypertension: Secondary | ICD-10-CM | POA: Diagnosis not present

## 2016-06-06 DIAGNOSIS — M545 Low back pain: Secondary | ICD-10-CM | POA: Diagnosis not present

## 2016-07-15 DIAGNOSIS — M542 Cervicalgia: Secondary | ICD-10-CM | POA: Diagnosis not present

## 2016-07-15 DIAGNOSIS — R109 Unspecified abdominal pain: Secondary | ICD-10-CM | POA: Diagnosis not present

## 2016-09-05 DIAGNOSIS — Z1231 Encounter for screening mammogram for malignant neoplasm of breast: Secondary | ICD-10-CM | POA: Diagnosis not present

## 2016-10-11 DIAGNOSIS — Z8542 Personal history of malignant neoplasm of other parts of uterus: Secondary | ICD-10-CM | POA: Diagnosis not present

## 2016-10-11 DIAGNOSIS — I10 Essential (primary) hypertension: Secondary | ICD-10-CM | POA: Diagnosis not present

## 2016-10-11 DIAGNOSIS — Z0000 Encounter for general adult medical examination without abnormal findings: Secondary | ICD-10-CM | POA: Diagnosis not present

## 2016-10-11 DIAGNOSIS — Z1159 Encounter for screening for other viral diseases: Secondary | ICD-10-CM | POA: Diagnosis not present

## 2016-10-11 DIAGNOSIS — E785 Hyperlipidemia, unspecified: Secondary | ICD-10-CM | POA: Diagnosis not present

## 2016-10-11 DIAGNOSIS — Z7984 Long term (current) use of oral hypoglycemic drugs: Secondary | ICD-10-CM | POA: Diagnosis not present

## 2016-10-11 DIAGNOSIS — E1122 Type 2 diabetes mellitus with diabetic chronic kidney disease: Secondary | ICD-10-CM | POA: Diagnosis not present

## 2016-11-02 DIAGNOSIS — Z01419 Encounter for gynecological examination (general) (routine) without abnormal findings: Secondary | ICD-10-CM | POA: Diagnosis not present

## 2016-11-02 DIAGNOSIS — Z8542 Personal history of malignant neoplasm of other parts of uterus: Secondary | ICD-10-CM | POA: Diagnosis not present

## 2016-11-02 DIAGNOSIS — N9089 Other specified noninflammatory disorders of vulva and perineum: Secondary | ICD-10-CM | POA: Diagnosis not present

## 2016-12-06 DIAGNOSIS — I10 Essential (primary) hypertension: Secondary | ICD-10-CM | POA: Diagnosis not present

## 2016-12-06 DIAGNOSIS — R609 Edema, unspecified: Secondary | ICD-10-CM | POA: Diagnosis not present

## 2017-01-24 DIAGNOSIS — R609 Edema, unspecified: Secondary | ICD-10-CM | POA: Diagnosis not present

## 2017-01-24 DIAGNOSIS — I10 Essential (primary) hypertension: Secondary | ICD-10-CM | POA: Diagnosis not present

## 2017-02-12 IMAGING — RF DG LUMBAR SPINE 2-3V
1 series · 2 of 2 positions shown · non-contrast
Comparison: MRI 11/04/2014

CLINICAL DATA: Lumbar fusion

EXAM:
DG C-ARM 61-120 MIN; LUMBAR SPINE - 2-3 VIEW

[Series 1: run · 2 of 2 slices shown]
[im 1/2]
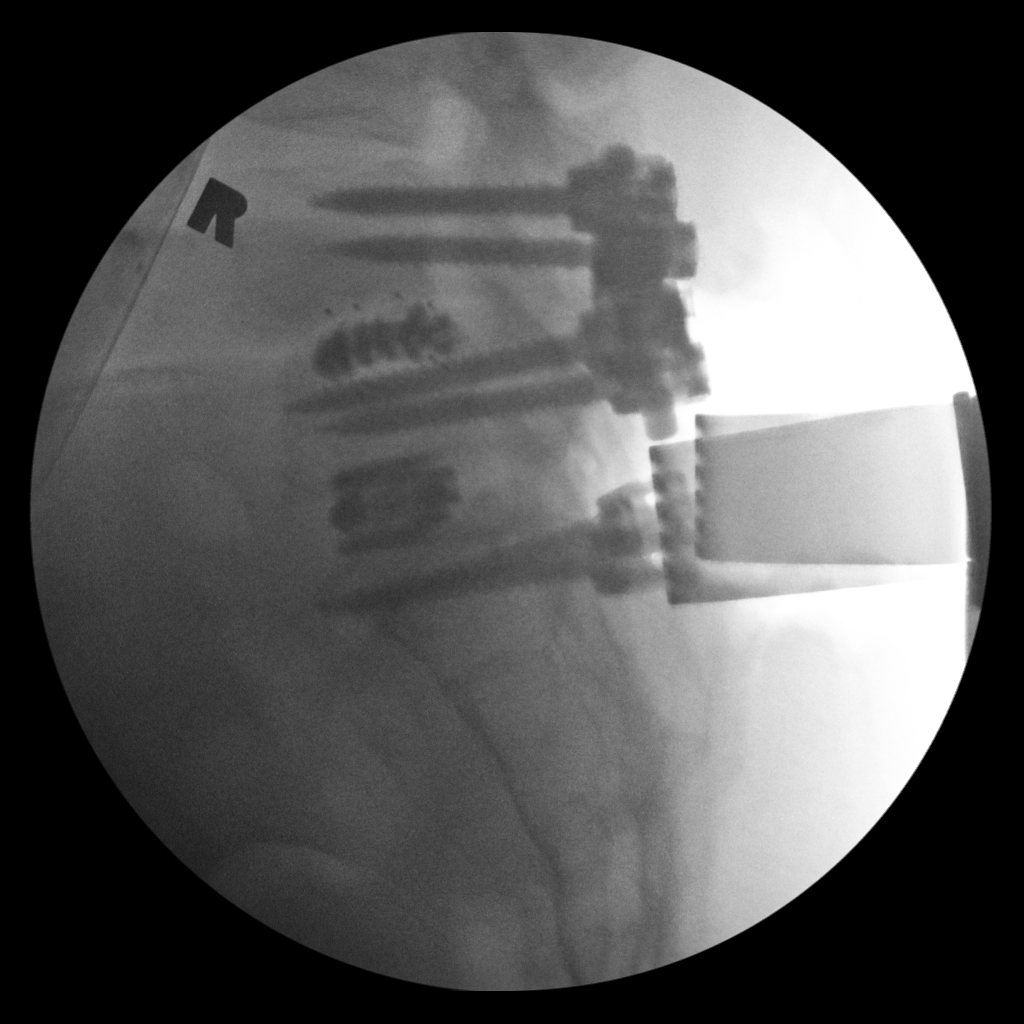
[im 2/2]
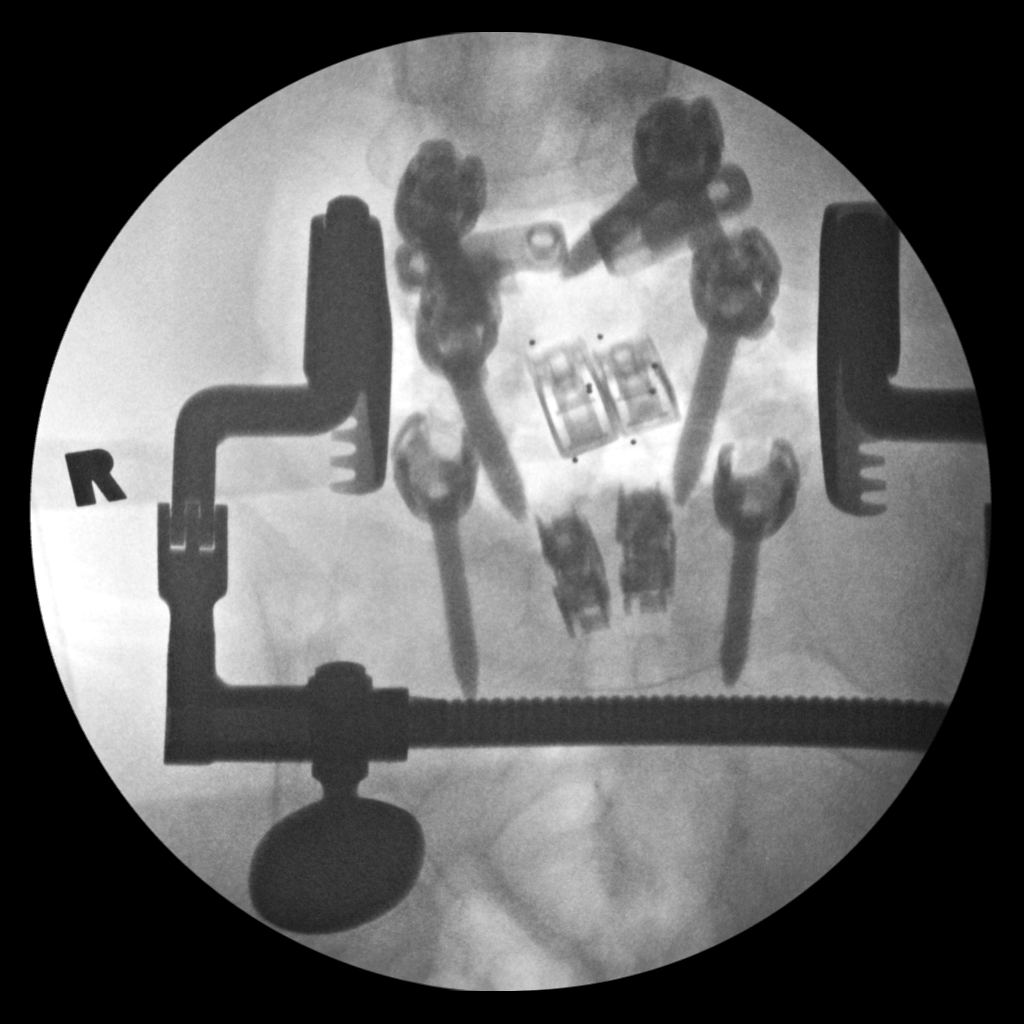

[2 of 2 positions shown; findings below may reference images not displayed]

FINDINGS: Lumbar vertebra are numbered as per prior MRI. L4-L5 and L5-S1
posterior and interbody fusion hardware noted.
IMPRESSION: Postsurgical changes lower lumbar spine .

## 2017-02-24 DIAGNOSIS — G629 Polyneuropathy, unspecified: Secondary | ICD-10-CM | POA: Diagnosis not present

## 2017-02-24 DIAGNOSIS — R609 Edema, unspecified: Secondary | ICD-10-CM | POA: Diagnosis not present

## 2017-02-24 DIAGNOSIS — R202 Paresthesia of skin: Secondary | ICD-10-CM | POA: Diagnosis not present

## 2017-04-05 DIAGNOSIS — R609 Edema, unspecified: Secondary | ICD-10-CM | POA: Diagnosis not present

## 2017-04-05 DIAGNOSIS — L239 Allergic contact dermatitis, unspecified cause: Secondary | ICD-10-CM | POA: Diagnosis not present

## 2017-05-04 DIAGNOSIS — I10 Essential (primary) hypertension: Secondary | ICD-10-CM | POA: Diagnosis not present

## 2017-05-04 DIAGNOSIS — E1122 Type 2 diabetes mellitus with diabetic chronic kidney disease: Secondary | ICD-10-CM | POA: Diagnosis not present

## 2017-05-04 DIAGNOSIS — G8929 Other chronic pain: Secondary | ICD-10-CM | POA: Diagnosis not present

## 2017-05-04 DIAGNOSIS — Z23 Encounter for immunization: Secondary | ICD-10-CM | POA: Diagnosis not present

## 2017-05-04 DIAGNOSIS — R748 Abnormal levels of other serum enzymes: Secondary | ICD-10-CM | POA: Diagnosis not present

## 2017-05-04 DIAGNOSIS — R600 Localized edema: Secondary | ICD-10-CM | POA: Diagnosis not present

## 2017-05-04 DIAGNOSIS — N183 Chronic kidney disease, stage 3 (moderate): Secondary | ICD-10-CM | POA: Diagnosis not present

## 2017-05-04 DIAGNOSIS — Z7984 Long term (current) use of oral hypoglycemic drugs: Secondary | ICD-10-CM | POA: Diagnosis not present

## 2017-05-04 DIAGNOSIS — M5440 Lumbago with sciatica, unspecified side: Secondary | ICD-10-CM | POA: Diagnosis not present

## 2017-05-04 DIAGNOSIS — E782 Mixed hyperlipidemia: Secondary | ICD-10-CM | POA: Diagnosis not present

## 2017-05-04 DIAGNOSIS — E1142 Type 2 diabetes mellitus with diabetic polyneuropathy: Secondary | ICD-10-CM | POA: Diagnosis not present

## 2017-05-19 DIAGNOSIS — R1011 Right upper quadrant pain: Secondary | ICD-10-CM | POA: Diagnosis not present

## 2017-05-19 DIAGNOSIS — Z9049 Acquired absence of other specified parts of digestive tract: Secondary | ICD-10-CM | POA: Diagnosis not present

## 2017-05-22 DIAGNOSIS — G629 Polyneuropathy, unspecified: Secondary | ICD-10-CM | POA: Diagnosis not present

## 2017-05-22 DIAGNOSIS — E1142 Type 2 diabetes mellitus with diabetic polyneuropathy: Secondary | ICD-10-CM | POA: Diagnosis not present

## 2017-05-22 DIAGNOSIS — Z7984 Long term (current) use of oral hypoglycemic drugs: Secondary | ICD-10-CM | POA: Diagnosis not present

## 2017-05-26 DIAGNOSIS — N9089 Other specified noninflammatory disorders of vulva and perineum: Secondary | ICD-10-CM | POA: Diagnosis not present

## 2017-09-18 DIAGNOSIS — M542 Cervicalgia: Secondary | ICD-10-CM | POA: Diagnosis not present

## 2017-09-26 DIAGNOSIS — Z1231 Encounter for screening mammogram for malignant neoplasm of breast: Secondary | ICD-10-CM | POA: Diagnosis not present

## 2017-10-23 DIAGNOSIS — Z0000 Encounter for general adult medical examination without abnormal findings: Secondary | ICD-10-CM | POA: Diagnosis not present

## 2017-10-23 DIAGNOSIS — E1122 Type 2 diabetes mellitus with diabetic chronic kidney disease: Secondary | ICD-10-CM | POA: Diagnosis not present

## 2017-10-23 DIAGNOSIS — E1142 Type 2 diabetes mellitus with diabetic polyneuropathy: Secondary | ICD-10-CM | POA: Diagnosis not present

## 2017-10-23 DIAGNOSIS — I10 Essential (primary) hypertension: Secondary | ICD-10-CM | POA: Diagnosis not present

## 2017-10-23 DIAGNOSIS — N183 Chronic kidney disease, stage 3 (moderate): Secondary | ICD-10-CM | POA: Diagnosis not present

## 2017-10-23 DIAGNOSIS — E785 Hyperlipidemia, unspecified: Secondary | ICD-10-CM | POA: Diagnosis not present

## 2017-11-03 DIAGNOSIS — Z8542 Personal history of malignant neoplasm of other parts of uterus: Secondary | ICD-10-CM | POA: Diagnosis not present

## 2017-11-03 DIAGNOSIS — Z9189 Other specified personal risk factors, not elsewhere classified: Secondary | ICD-10-CM | POA: Diagnosis not present

## 2017-11-03 DIAGNOSIS — N644 Mastodynia: Secondary | ICD-10-CM | POA: Diagnosis not present

## 2017-11-03 DIAGNOSIS — Z780 Asymptomatic menopausal state: Secondary | ICD-10-CM | POA: Diagnosis not present

## 2017-11-30 DIAGNOSIS — Z7984 Long term (current) use of oral hypoglycemic drugs: Secondary | ICD-10-CM | POA: Diagnosis not present

## 2017-11-30 DIAGNOSIS — K644 Residual hemorrhoidal skin tags: Secondary | ICD-10-CM | POA: Diagnosis not present

## 2017-11-30 DIAGNOSIS — E1142 Type 2 diabetes mellitus with diabetic polyneuropathy: Secondary | ICD-10-CM | POA: Diagnosis not present

## 2017-11-30 DIAGNOSIS — R609 Edema, unspecified: Secondary | ICD-10-CM | POA: Diagnosis not present

## 2017-11-30 DIAGNOSIS — I10 Essential (primary) hypertension: Secondary | ICD-10-CM | POA: Diagnosis not present

## 2017-11-30 DIAGNOSIS — N183 Chronic kidney disease, stage 3 (moderate): Secondary | ICD-10-CM | POA: Diagnosis not present

## 2017-12-12 DIAGNOSIS — I129 Hypertensive chronic kidney disease with stage 1 through stage 4 chronic kidney disease, or unspecified chronic kidney disease: Secondary | ICD-10-CM | POA: Diagnosis not present

## 2017-12-12 DIAGNOSIS — R600 Localized edema: Secondary | ICD-10-CM | POA: Diagnosis not present

## 2017-12-12 DIAGNOSIS — E1122 Type 2 diabetes mellitus with diabetic chronic kidney disease: Secondary | ICD-10-CM | POA: Diagnosis not present

## 2017-12-12 DIAGNOSIS — R609 Edema, unspecified: Secondary | ICD-10-CM | POA: Diagnosis not present

## 2017-12-12 DIAGNOSIS — N183 Chronic kidney disease, stage 3 (moderate): Secondary | ICD-10-CM | POA: Diagnosis not present

## 2017-12-14 DIAGNOSIS — R600 Localized edema: Secondary | ICD-10-CM | POA: Diagnosis not present

## 2017-12-14 DIAGNOSIS — R609 Edema, unspecified: Secondary | ICD-10-CM | POA: Diagnosis not present

## 2017-12-19 DIAGNOSIS — Z780 Asymptomatic menopausal state: Secondary | ICD-10-CM | POA: Diagnosis not present

## 2017-12-19 DIAGNOSIS — M8588 Other specified disorders of bone density and structure, other site: Secondary | ICD-10-CM | POA: Diagnosis not present

## 2018-01-29 DIAGNOSIS — N6310 Unspecified lump in the right breast, unspecified quadrant: Secondary | ICD-10-CM | POA: Diagnosis not present

## 2018-01-29 DIAGNOSIS — Z7984 Long term (current) use of oral hypoglycemic drugs: Secondary | ICD-10-CM | POA: Diagnosis not present

## 2018-01-29 DIAGNOSIS — G629 Polyneuropathy, unspecified: Secondary | ICD-10-CM | POA: Diagnosis not present

## 2018-01-29 DIAGNOSIS — E785 Hyperlipidemia, unspecified: Secondary | ICD-10-CM | POA: Diagnosis not present

## 2018-01-29 DIAGNOSIS — Z6836 Body mass index (BMI) 36.0-36.9, adult: Secondary | ICD-10-CM | POA: Diagnosis not present

## 2018-01-29 DIAGNOSIS — N644 Mastodynia: Secondary | ICD-10-CM | POA: Diagnosis not present

## 2018-01-29 DIAGNOSIS — E1122 Type 2 diabetes mellitus with diabetic chronic kidney disease: Secondary | ICD-10-CM | POA: Diagnosis not present

## 2018-02-07 DIAGNOSIS — N6312 Unspecified lump in the right breast, upper inner quadrant: Secondary | ICD-10-CM | POA: Diagnosis not present

## 2018-02-07 DIAGNOSIS — N644 Mastodynia: Secondary | ICD-10-CM | POA: Diagnosis not present

## 2018-02-07 DIAGNOSIS — R922 Inconclusive mammogram: Secondary | ICD-10-CM | POA: Diagnosis not present

## 2018-02-14 DIAGNOSIS — N6310 Unspecified lump in the right breast, unspecified quadrant: Secondary | ICD-10-CM | POA: Diagnosis not present

## 2018-02-14 DIAGNOSIS — N6312 Unspecified lump in the right breast, upper inner quadrant: Secondary | ICD-10-CM | POA: Diagnosis not present

## 2018-02-14 DIAGNOSIS — N644 Mastodynia: Secondary | ICD-10-CM | POA: Diagnosis not present

## 2018-03-02 DIAGNOSIS — C50911 Malignant neoplasm of unspecified site of right female breast: Secondary | ICD-10-CM | POA: Diagnosis not present

## 2018-03-08 DIAGNOSIS — C55 Malignant neoplasm of uterus, part unspecified: Secondary | ICD-10-CM | POA: Diagnosis not present

## 2018-03-08 DIAGNOSIS — C50912 Malignant neoplasm of unspecified site of left female breast: Secondary | ICD-10-CM | POA: Diagnosis not present

## 2018-03-08 DIAGNOSIS — Z8042 Family history of malignant neoplasm of prostate: Secondary | ICD-10-CM | POA: Diagnosis not present

## 2018-03-08 DIAGNOSIS — Z803 Family history of malignant neoplasm of breast: Secondary | ICD-10-CM | POA: Diagnosis not present

## 2018-03-08 DIAGNOSIS — Z7183 Encounter for nonprocreative genetic counseling: Secondary | ICD-10-CM | POA: Diagnosis not present

## 2018-03-09 DIAGNOSIS — C50911 Malignant neoplasm of unspecified site of right female breast: Secondary | ICD-10-CM | POA: Diagnosis not present

## 2018-03-15 DIAGNOSIS — D0511 Intraductal carcinoma in situ of right breast: Secondary | ICD-10-CM | POA: Diagnosis not present

## 2018-03-23 DIAGNOSIS — Z1379 Encounter for other screening for genetic and chromosomal anomalies: Secondary | ICD-10-CM | POA: Diagnosis not present

## 2018-03-23 DIAGNOSIS — C50911 Malignant neoplasm of unspecified site of right female breast: Secondary | ICD-10-CM | POA: Diagnosis not present

## 2018-03-23 DIAGNOSIS — I129 Hypertensive chronic kidney disease with stage 1 through stage 4 chronic kidney disease, or unspecified chronic kidney disease: Secondary | ICD-10-CM | POA: Diagnosis not present

## 2018-03-23 DIAGNOSIS — N189 Chronic kidney disease, unspecified: Secondary | ICD-10-CM | POA: Diagnosis not present

## 2018-03-23 DIAGNOSIS — Z8542 Personal history of malignant neoplasm of other parts of uterus: Secondary | ICD-10-CM | POA: Diagnosis not present

## 2018-03-23 DIAGNOSIS — R922 Inconclusive mammogram: Secondary | ICD-10-CM | POA: Diagnosis not present

## 2018-03-23 DIAGNOSIS — G709 Myoneural disorder, unspecified: Secondary | ICD-10-CM | POA: Diagnosis not present

## 2018-03-23 DIAGNOSIS — M1990 Unspecified osteoarthritis, unspecified site: Secondary | ICD-10-CM | POA: Diagnosis not present

## 2018-03-23 DIAGNOSIS — E785 Hyperlipidemia, unspecified: Secondary | ICD-10-CM | POA: Diagnosis not present

## 2018-03-23 DIAGNOSIS — Z87891 Personal history of nicotine dependence: Secondary | ICD-10-CM | POA: Diagnosis not present

## 2018-04-05 DIAGNOSIS — Z853 Personal history of malignant neoplasm of breast: Secondary | ICD-10-CM | POA: Diagnosis not present

## 2018-04-25 DIAGNOSIS — Z79899 Other long term (current) drug therapy: Secondary | ICD-10-CM | POA: Diagnosis not present

## 2018-04-25 DIAGNOSIS — M1990 Unspecified osteoarthritis, unspecified site: Secondary | ICD-10-CM | POA: Diagnosis not present

## 2018-04-25 DIAGNOSIS — E1122 Type 2 diabetes mellitus with diabetic chronic kidney disease: Secondary | ICD-10-CM | POA: Diagnosis not present

## 2018-04-25 DIAGNOSIS — Z7984 Long term (current) use of oral hypoglycemic drugs: Secondary | ICD-10-CM | POA: Diagnosis not present

## 2018-04-25 DIAGNOSIS — D0511 Intraductal carcinoma in situ of right breast: Secondary | ICD-10-CM | POA: Diagnosis not present

## 2018-04-25 DIAGNOSIS — Z170 Estrogen receptor positive status [ER+]: Secondary | ICD-10-CM | POA: Diagnosis not present

## 2018-04-25 DIAGNOSIS — E669 Obesity, unspecified: Secondary | ICD-10-CM | POA: Diagnosis not present

## 2018-04-25 DIAGNOSIS — Z7982 Long term (current) use of aspirin: Secondary | ICD-10-CM | POA: Diagnosis not present

## 2018-04-25 DIAGNOSIS — E785 Hyperlipidemia, unspecified: Secondary | ICD-10-CM | POA: Diagnosis not present

## 2018-04-25 DIAGNOSIS — Z87891 Personal history of nicotine dependence: Secondary | ICD-10-CM | POA: Diagnosis not present

## 2018-04-25 DIAGNOSIS — Z6838 Body mass index (BMI) 38.0-38.9, adult: Secondary | ICD-10-CM | POA: Diagnosis not present

## 2018-04-25 DIAGNOSIS — N183 Chronic kidney disease, stage 3 (moderate): Secondary | ICD-10-CM | POA: Diagnosis not present

## 2018-04-25 DIAGNOSIS — I129 Hypertensive chronic kidney disease with stage 1 through stage 4 chronic kidney disease, or unspecified chronic kidney disease: Secondary | ICD-10-CM | POA: Diagnosis not present

## 2018-05-11 DIAGNOSIS — C541 Malignant neoplasm of endometrium: Secondary | ICD-10-CM | POA: Diagnosis not present

## 2018-05-11 DIAGNOSIS — D0511 Intraductal carcinoma in situ of right breast: Secondary | ICD-10-CM | POA: Diagnosis not present

## 2018-05-11 DIAGNOSIS — Z1379 Encounter for other screening for genetic and chromosomal anomalies: Secondary | ICD-10-CM | POA: Diagnosis not present

## 2018-05-11 DIAGNOSIS — I10 Essential (primary) hypertension: Secondary | ICD-10-CM | POA: Diagnosis not present

## 2018-05-30 DIAGNOSIS — M899 Disorder of bone, unspecified: Secondary | ICD-10-CM | POA: Diagnosis not present

## 2018-05-30 DIAGNOSIS — D0511 Intraductal carcinoma in situ of right breast: Secondary | ICD-10-CM | POA: Diagnosis not present

## 2018-05-31 DIAGNOSIS — E785 Hyperlipidemia, unspecified: Secondary | ICD-10-CM | POA: Diagnosis not present

## 2018-05-31 DIAGNOSIS — Z23 Encounter for immunization: Secondary | ICD-10-CM | POA: Diagnosis not present

## 2018-05-31 DIAGNOSIS — Z7984 Long term (current) use of oral hypoglycemic drugs: Secondary | ICD-10-CM | POA: Diagnosis not present

## 2018-05-31 DIAGNOSIS — E1122 Type 2 diabetes mellitus with diabetic chronic kidney disease: Secondary | ICD-10-CM | POA: Diagnosis not present

## 2018-05-31 DIAGNOSIS — I10 Essential (primary) hypertension: Secondary | ICD-10-CM | POA: Diagnosis not present

## 2018-05-31 DIAGNOSIS — D0512 Intraductal carcinoma in situ of left breast: Secondary | ICD-10-CM | POA: Diagnosis not present

## 2018-06-01 DIAGNOSIS — C541 Malignant neoplasm of endometrium: Secondary | ICD-10-CM | POA: Diagnosis not present

## 2018-06-01 DIAGNOSIS — Z1379 Encounter for other screening for genetic and chromosomal anomalies: Secondary | ICD-10-CM | POA: Diagnosis not present

## 2018-06-01 DIAGNOSIS — D0511 Intraductal carcinoma in situ of right breast: Secondary | ICD-10-CM | POA: Diagnosis not present

## 2018-06-01 DIAGNOSIS — I10 Essential (primary) hypertension: Secondary | ICD-10-CM | POA: Diagnosis not present

## 2018-06-29 DIAGNOSIS — C50911 Malignant neoplasm of unspecified site of right female breast: Secondary | ICD-10-CM | POA: Diagnosis not present

## 2018-07-06 DIAGNOSIS — Z79811 Long term (current) use of aromatase inhibitors: Secondary | ICD-10-CM | POA: Diagnosis not present

## 2018-07-06 DIAGNOSIS — D0511 Intraductal carcinoma in situ of right breast: Secondary | ICD-10-CM | POA: Diagnosis not present

## 2018-07-06 DIAGNOSIS — I10 Essential (primary) hypertension: Secondary | ICD-10-CM | POA: Diagnosis not present

## 2018-07-06 DIAGNOSIS — C541 Malignant neoplasm of endometrium: Secondary | ICD-10-CM | POA: Diagnosis not present

## 2018-07-06 DIAGNOSIS — E119 Type 2 diabetes mellitus without complications: Secondary | ICD-10-CM | POA: Diagnosis not present

## 2018-07-06 DIAGNOSIS — Z170 Estrogen receptor positive status [ER+]: Secondary | ICD-10-CM | POA: Diagnosis not present

## 2018-07-12 DIAGNOSIS — M2042 Other hammer toe(s) (acquired), left foot: Secondary | ICD-10-CM | POA: Diagnosis not present

## 2018-07-12 DIAGNOSIS — L602 Onychogryphosis: Secondary | ICD-10-CM | POA: Diagnosis not present

## 2018-07-12 DIAGNOSIS — E1140 Type 2 diabetes mellitus with diabetic neuropathy, unspecified: Secondary | ICD-10-CM | POA: Diagnosis not present

## 2018-07-12 DIAGNOSIS — M2041 Other hammer toe(s) (acquired), right foot: Secondary | ICD-10-CM | POA: Diagnosis not present

## 2018-07-19 DIAGNOSIS — C50911 Malignant neoplasm of unspecified site of right female breast: Secondary | ICD-10-CM | POA: Diagnosis not present

## 2018-07-23 DIAGNOSIS — C50911 Malignant neoplasm of unspecified site of right female breast: Secondary | ICD-10-CM | POA: Diagnosis not present

## 2018-07-24 DIAGNOSIS — E1140 Type 2 diabetes mellitus with diabetic neuropathy, unspecified: Secondary | ICD-10-CM | POA: Diagnosis not present

## 2018-07-24 DIAGNOSIS — R531 Weakness: Secondary | ICD-10-CM | POA: Diagnosis not present

## 2018-07-24 DIAGNOSIS — I10 Essential (primary) hypertension: Secondary | ICD-10-CM | POA: Diagnosis not present

## 2018-07-24 DIAGNOSIS — Z853 Personal history of malignant neoplasm of breast: Secondary | ICD-10-CM | POA: Diagnosis not present

## 2018-07-24 DIAGNOSIS — C50911 Malignant neoplasm of unspecified site of right female breast: Secondary | ICD-10-CM | POA: Diagnosis not present

## 2018-07-24 DIAGNOSIS — R2689 Other abnormalities of gait and mobility: Secondary | ICD-10-CM | POA: Diagnosis not present

## 2018-07-31 DIAGNOSIS — R531 Weakness: Secondary | ICD-10-CM | POA: Diagnosis not present

## 2018-07-31 DIAGNOSIS — R2689 Other abnormalities of gait and mobility: Secondary | ICD-10-CM | POA: Diagnosis not present

## 2018-08-08 DIAGNOSIS — R531 Weakness: Secondary | ICD-10-CM | POA: Diagnosis not present

## 2018-08-08 DIAGNOSIS — R2689 Other abnormalities of gait and mobility: Secondary | ICD-10-CM | POA: Diagnosis not present

## 2018-08-15 DIAGNOSIS — R2689 Other abnormalities of gait and mobility: Secondary | ICD-10-CM | POA: Diagnosis not present

## 2018-08-15 DIAGNOSIS — R29898 Other symptoms and signs involving the musculoskeletal system: Secondary | ICD-10-CM | POA: Diagnosis not present

## 2018-08-22 DIAGNOSIS — R2689 Other abnormalities of gait and mobility: Secondary | ICD-10-CM | POA: Diagnosis not present

## 2018-08-22 DIAGNOSIS — R29898 Other symptoms and signs involving the musculoskeletal system: Secondary | ICD-10-CM | POA: Diagnosis not present

## 2018-08-29 DIAGNOSIS — R2689 Other abnormalities of gait and mobility: Secondary | ICD-10-CM | POA: Diagnosis not present

## 2018-08-29 DIAGNOSIS — R29898 Other symptoms and signs involving the musculoskeletal system: Secondary | ICD-10-CM | POA: Diagnosis not present

## 2018-09-05 DIAGNOSIS — R29898 Other symptoms and signs involving the musculoskeletal system: Secondary | ICD-10-CM | POA: Diagnosis not present

## 2018-09-05 DIAGNOSIS — R2689 Other abnormalities of gait and mobility: Secondary | ICD-10-CM | POA: Diagnosis not present

## 2018-09-06 DIAGNOSIS — D0512 Intraductal carcinoma in situ of left breast: Secondary | ICD-10-CM | POA: Diagnosis not present

## 2018-09-06 DIAGNOSIS — E785 Hyperlipidemia, unspecified: Secondary | ICD-10-CM | POA: Diagnosis not present

## 2018-09-06 DIAGNOSIS — Z6836 Body mass index (BMI) 36.0-36.9, adult: Secondary | ICD-10-CM | POA: Diagnosis not present

## 2018-09-06 DIAGNOSIS — I10 Essential (primary) hypertension: Secondary | ICD-10-CM | POA: Diagnosis not present

## 2018-09-06 DIAGNOSIS — E1122 Type 2 diabetes mellitus with diabetic chronic kidney disease: Secondary | ICD-10-CM | POA: Diagnosis not present

## 2018-09-06 DIAGNOSIS — N183 Chronic kidney disease, stage 3 (moderate): Secondary | ICD-10-CM | POA: Diagnosis not present

## 2018-11-05 DIAGNOSIS — Z79811 Long term (current) use of aromatase inhibitors: Secondary | ICD-10-CM | POA: Diagnosis not present

## 2018-11-05 DIAGNOSIS — D0511 Intraductal carcinoma in situ of right breast: Secondary | ICD-10-CM | POA: Diagnosis not present

## 2018-11-13 DIAGNOSIS — C541 Malignant neoplasm of endometrium: Secondary | ICD-10-CM | POA: Diagnosis not present

## 2018-11-13 DIAGNOSIS — D0511 Intraductal carcinoma in situ of right breast: Secondary | ICD-10-CM | POA: Diagnosis not present

## 2018-11-13 DIAGNOSIS — Z170 Estrogen receptor positive status [ER+]: Secondary | ICD-10-CM | POA: Diagnosis not present

## 2018-11-13 DIAGNOSIS — Z8542 Personal history of malignant neoplasm of other parts of uterus: Secondary | ICD-10-CM | POA: Diagnosis not present

## 2018-11-13 DIAGNOSIS — Z87891 Personal history of nicotine dependence: Secondary | ICD-10-CM | POA: Diagnosis not present

## 2018-11-13 DIAGNOSIS — Z79811 Long term (current) use of aromatase inhibitors: Secondary | ICD-10-CM | POA: Diagnosis not present

## 2018-11-13 DIAGNOSIS — I10 Essential (primary) hypertension: Secondary | ICD-10-CM | POA: Diagnosis not present

## 2018-12-05 DIAGNOSIS — Z0000 Encounter for general adult medical examination without abnormal findings: Secondary | ICD-10-CM | POA: Diagnosis not present

## 2018-12-05 DIAGNOSIS — D0511 Intraductal carcinoma in situ of right breast: Secondary | ICD-10-CM | POA: Diagnosis not present

## 2018-12-05 DIAGNOSIS — Z8542 Personal history of malignant neoplasm of other parts of uterus: Secondary | ICD-10-CM | POA: Diagnosis not present

## 2018-12-05 DIAGNOSIS — N183 Chronic kidney disease, stage 3 (moderate): Secondary | ICD-10-CM | POA: Diagnosis not present

## 2018-12-05 DIAGNOSIS — Z13820 Encounter for screening for osteoporosis: Secondary | ICD-10-CM | POA: Diagnosis not present

## 2018-12-05 DIAGNOSIS — I10 Essential (primary) hypertension: Secondary | ICD-10-CM | POA: Diagnosis not present

## 2018-12-05 DIAGNOSIS — Z1211 Encounter for screening for malignant neoplasm of colon: Secondary | ICD-10-CM | POA: Diagnosis not present

## 2018-12-05 DIAGNOSIS — E785 Hyperlipidemia, unspecified: Secondary | ICD-10-CM | POA: Diagnosis not present

## 2018-12-05 DIAGNOSIS — Z6836 Body mass index (BMI) 36.0-36.9, adult: Secondary | ICD-10-CM | POA: Diagnosis not present

## 2018-12-05 DIAGNOSIS — E1122 Type 2 diabetes mellitus with diabetic chronic kidney disease: Secondary | ICD-10-CM | POA: Diagnosis not present

## 2019-01-07 DIAGNOSIS — E785 Hyperlipidemia, unspecified: Secondary | ICD-10-CM | POA: Diagnosis not present

## 2019-01-07 DIAGNOSIS — E1122 Type 2 diabetes mellitus with diabetic chronic kidney disease: Secondary | ICD-10-CM | POA: Diagnosis not present

## 2019-01-07 DIAGNOSIS — I10 Essential (primary) hypertension: Secondary | ICD-10-CM | POA: Diagnosis not present

## 2019-01-23 DIAGNOSIS — C50911 Malignant neoplasm of unspecified site of right female breast: Secondary | ICD-10-CM | POA: Diagnosis not present

## 2019-02-04 DIAGNOSIS — S82832A Other fracture of upper and lower end of left fibula, initial encounter for closed fracture: Secondary | ICD-10-CM | POA: Diagnosis not present

## 2019-02-04 DIAGNOSIS — M25571 Pain in right ankle and joints of right foot: Secondary | ICD-10-CM | POA: Diagnosis not present

## 2019-02-04 DIAGNOSIS — T148XXA Other injury of unspecified body region, initial encounter: Secondary | ICD-10-CM | POA: Diagnosis not present

## 2019-02-05 DIAGNOSIS — S8265XA Nondisplaced fracture of lateral malleolus of left fibula, initial encounter for closed fracture: Secondary | ICD-10-CM | POA: Diagnosis not present

## 2019-02-07 DIAGNOSIS — E1140 Type 2 diabetes mellitus with diabetic neuropathy, unspecified: Secondary | ICD-10-CM | POA: Diagnosis not present

## 2019-02-07 DIAGNOSIS — L602 Onychogryphosis: Secondary | ICD-10-CM | POA: Diagnosis not present

## 2019-02-13 DIAGNOSIS — S8265XD Nondisplaced fracture of lateral malleolus of left fibula, subsequent encounter for closed fracture with routine healing: Secondary | ICD-10-CM | POA: Diagnosis not present

## 2019-02-22 DIAGNOSIS — Z853 Personal history of malignant neoplasm of breast: Secondary | ICD-10-CM | POA: Diagnosis not present

## 2019-02-22 DIAGNOSIS — Z08 Encounter for follow-up examination after completed treatment for malignant neoplasm: Secondary | ICD-10-CM | POA: Diagnosis not present

## 2019-02-22 DIAGNOSIS — Z79811 Long term (current) use of aromatase inhibitors: Secondary | ICD-10-CM | POA: Diagnosis not present

## 2019-02-22 DIAGNOSIS — Z90710 Acquired absence of both cervix and uterus: Secondary | ICD-10-CM | POA: Diagnosis not present

## 2019-02-22 DIAGNOSIS — Z9011 Acquired absence of right breast and nipple: Secondary | ICD-10-CM | POA: Diagnosis not present

## 2019-02-22 DIAGNOSIS — Z5181 Encounter for therapeutic drug level monitoring: Secondary | ICD-10-CM | POA: Diagnosis not present

## 2019-02-22 DIAGNOSIS — D0511 Intraductal carcinoma in situ of right breast: Secondary | ICD-10-CM | POA: Diagnosis not present

## 2019-02-22 DIAGNOSIS — Z1239 Encounter for other screening for malignant neoplasm of breast: Secondary | ICD-10-CM | POA: Diagnosis not present

## 2019-02-22 DIAGNOSIS — Z90722 Acquired absence of ovaries, bilateral: Secondary | ICD-10-CM | POA: Diagnosis not present

## 2019-02-22 DIAGNOSIS — Z8542 Personal history of malignant neoplasm of other parts of uterus: Secondary | ICD-10-CM | POA: Diagnosis not present

## 2019-02-22 DIAGNOSIS — Z87891 Personal history of nicotine dependence: Secondary | ICD-10-CM | POA: Diagnosis not present

## 2019-02-22 DIAGNOSIS — I10 Essential (primary) hypertension: Secondary | ICD-10-CM | POA: Diagnosis not present

## 2019-03-05 DIAGNOSIS — C50911 Malignant neoplasm of unspecified site of right female breast: Secondary | ICD-10-CM | POA: Diagnosis not present

## 2019-03-06 DIAGNOSIS — S8265XD Nondisplaced fracture of lateral malleolus of left fibula, subsequent encounter for closed fracture with routine healing: Secondary | ICD-10-CM | POA: Diagnosis not present

## 2019-03-14 DIAGNOSIS — Z1231 Encounter for screening mammogram for malignant neoplasm of breast: Secondary | ICD-10-CM | POA: Diagnosis not present

## 2019-03-14 DIAGNOSIS — D0511 Intraductal carcinoma in situ of right breast: Secondary | ICD-10-CM | POA: Diagnosis not present

## 2019-03-14 DIAGNOSIS — Z1239 Encounter for other screening for malignant neoplasm of breast: Secondary | ICD-10-CM | POA: Diagnosis not present

## 2019-03-19 DIAGNOSIS — R208 Other disturbances of skin sensation: Secondary | ICD-10-CM | POA: Diagnosis not present

## 2019-04-03 DIAGNOSIS — S8265XD Nondisplaced fracture of lateral malleolus of left fibula, subsequent encounter for closed fracture with routine healing: Secondary | ICD-10-CM | POA: Diagnosis not present

## 2019-04-10 DIAGNOSIS — G5702 Lesion of sciatic nerve, left lower limb: Secondary | ICD-10-CM | POA: Diagnosis not present

## 2019-04-30 DIAGNOSIS — Z23 Encounter for immunization: Secondary | ICD-10-CM | POA: Diagnosis not present

## 2019-05-31 DIAGNOSIS — Z87898 Personal history of other specified conditions: Secondary | ICD-10-CM | POA: Diagnosis not present

## 2019-05-31 DIAGNOSIS — Z7982 Long term (current) use of aspirin: Secondary | ICD-10-CM | POA: Diagnosis not present

## 2019-05-31 DIAGNOSIS — G709 Myoneural disorder, unspecified: Secondary | ICD-10-CM | POA: Diagnosis not present

## 2019-05-31 DIAGNOSIS — E1122 Type 2 diabetes mellitus with diabetic chronic kidney disease: Secondary | ICD-10-CM | POA: Diagnosis not present

## 2019-05-31 DIAGNOSIS — N189 Chronic kidney disease, unspecified: Secondary | ICD-10-CM | POA: Diagnosis not present

## 2019-05-31 DIAGNOSIS — Z08 Encounter for follow-up examination after completed treatment for malignant neoplasm: Secondary | ICD-10-CM | POA: Diagnosis not present

## 2019-05-31 DIAGNOSIS — Z79899 Other long term (current) drug therapy: Secondary | ICD-10-CM | POA: Diagnosis not present

## 2019-05-31 DIAGNOSIS — Z853 Personal history of malignant neoplasm of breast: Secondary | ICD-10-CM | POA: Diagnosis not present

## 2019-05-31 DIAGNOSIS — I10 Essential (primary) hypertension: Secondary | ICD-10-CM | POA: Diagnosis not present

## 2019-05-31 DIAGNOSIS — Z8542 Personal history of malignant neoplasm of other parts of uterus: Secondary | ICD-10-CM | POA: Diagnosis not present

## 2019-05-31 DIAGNOSIS — E785 Hyperlipidemia, unspecified: Secondary | ICD-10-CM | POA: Diagnosis not present

## 2019-05-31 DIAGNOSIS — Z87891 Personal history of nicotine dependence: Secondary | ICD-10-CM | POA: Diagnosis not present

## 2019-05-31 DIAGNOSIS — Z9011 Acquired absence of right breast and nipple: Secondary | ICD-10-CM | POA: Diagnosis not present

## 2019-05-31 DIAGNOSIS — M1990 Unspecified osteoarthritis, unspecified site: Secondary | ICD-10-CM | POA: Diagnosis not present

## 2019-05-31 DIAGNOSIS — Z7984 Long term (current) use of oral hypoglycemic drugs: Secondary | ICD-10-CM | POA: Diagnosis not present

## 2019-05-31 DIAGNOSIS — I129 Hypertensive chronic kidney disease with stage 1 through stage 4 chronic kidney disease, or unspecified chronic kidney disease: Secondary | ICD-10-CM | POA: Diagnosis not present

## 2019-05-31 DIAGNOSIS — D0511 Intraductal carcinoma in situ of right breast: Secondary | ICD-10-CM | POA: Diagnosis not present

## 2019-05-31 DIAGNOSIS — Z79811 Long term (current) use of aromatase inhibitors: Secondary | ICD-10-CM | POA: Diagnosis not present

## 2019-06-10 DIAGNOSIS — I10 Essential (primary) hypertension: Secondary | ICD-10-CM | POA: Diagnosis not present

## 2019-06-10 DIAGNOSIS — D0511 Intraductal carcinoma in situ of right breast: Secondary | ICD-10-CM | POA: Diagnosis not present

## 2019-06-10 DIAGNOSIS — Z7189 Other specified counseling: Secondary | ICD-10-CM | POA: Diagnosis not present

## 2019-06-10 DIAGNOSIS — E1122 Type 2 diabetes mellitus with diabetic chronic kidney disease: Secondary | ICD-10-CM | POA: Diagnosis not present

## 2019-06-10 DIAGNOSIS — Z8542 Personal history of malignant neoplasm of other parts of uterus: Secondary | ICD-10-CM | POA: Diagnosis not present

## 2019-06-10 DIAGNOSIS — Z1211 Encounter for screening for malignant neoplasm of colon: Secondary | ICD-10-CM | POA: Diagnosis not present

## 2019-06-10 DIAGNOSIS — E782 Mixed hyperlipidemia: Secondary | ICD-10-CM | POA: Diagnosis not present

## 2019-06-10 DIAGNOSIS — N1831 Chronic kidney disease, stage 3a: Secondary | ICD-10-CM | POA: Diagnosis not present

## 2019-06-10 DIAGNOSIS — Z1231 Encounter for screening mammogram for malignant neoplasm of breast: Secondary | ICD-10-CM | POA: Diagnosis not present

## 2019-06-10 DIAGNOSIS — Z0000 Encounter for general adult medical examination without abnormal findings: Secondary | ICD-10-CM | POA: Diagnosis not present

## 2019-06-14 DIAGNOSIS — L602 Onychogryphosis: Secondary | ICD-10-CM | POA: Diagnosis not present

## 2019-06-14 DIAGNOSIS — E1140 Type 2 diabetes mellitus with diabetic neuropathy, unspecified: Secondary | ICD-10-CM | POA: Diagnosis not present

## 2019-07-11 DIAGNOSIS — C50911 Malignant neoplasm of unspecified site of right female breast: Secondary | ICD-10-CM | POA: Diagnosis not present

## 2019-08-13 DIAGNOSIS — C50911 Malignant neoplasm of unspecified site of right female breast: Secondary | ICD-10-CM | POA: Diagnosis not present

## 2019-09-30 DIAGNOSIS — Z87891 Personal history of nicotine dependence: Secondary | ICD-10-CM | POA: Diagnosis not present

## 2019-09-30 DIAGNOSIS — D0511 Intraductal carcinoma in situ of right breast: Secondary | ICD-10-CM | POA: Diagnosis not present

## 2019-09-30 DIAGNOSIS — Z853 Personal history of malignant neoplasm of breast: Secondary | ICD-10-CM | POA: Diagnosis not present

## 2019-09-30 DIAGNOSIS — Z08 Encounter for follow-up examination after completed treatment for malignant neoplasm: Secondary | ICD-10-CM | POA: Diagnosis not present

## 2019-09-30 DIAGNOSIS — I10 Essential (primary) hypertension: Secondary | ICD-10-CM | POA: Diagnosis not present

## 2019-09-30 DIAGNOSIS — Z79899 Other long term (current) drug therapy: Secondary | ICD-10-CM | POA: Diagnosis not present

## 2019-09-30 DIAGNOSIS — E1142 Type 2 diabetes mellitus with diabetic polyneuropathy: Secondary | ICD-10-CM | POA: Diagnosis not present

## 2019-09-30 DIAGNOSIS — Z79811 Long term (current) use of aromatase inhibitors: Secondary | ICD-10-CM | POA: Diagnosis not present

## 2019-09-30 DIAGNOSIS — B351 Tinea unguium: Secondary | ICD-10-CM | POA: Diagnosis not present

## 2019-10-16 DIAGNOSIS — D0511 Intraductal carcinoma in situ of right breast: Secondary | ICD-10-CM | POA: Diagnosis not present

## 2019-12-04 DIAGNOSIS — E782 Mixed hyperlipidemia: Secondary | ICD-10-CM | POA: Diagnosis not present

## 2019-12-04 DIAGNOSIS — E1122 Type 2 diabetes mellitus with diabetic chronic kidney disease: Secondary | ICD-10-CM | POA: Diagnosis not present

## 2019-12-04 DIAGNOSIS — Z1211 Encounter for screening for malignant neoplasm of colon: Secondary | ICD-10-CM | POA: Diagnosis not present

## 2019-12-04 DIAGNOSIS — I10 Essential (primary) hypertension: Secondary | ICD-10-CM | POA: Diagnosis not present

## 2019-12-04 DIAGNOSIS — Z794 Long term (current) use of insulin: Secondary | ICD-10-CM | POA: Diagnosis not present

## 2019-12-04 DIAGNOSIS — N1831 Chronic kidney disease, stage 3a: Secondary | ICD-10-CM | POA: Diagnosis not present

## 2019-12-04 DIAGNOSIS — Z6838 Body mass index (BMI) 38.0-38.9, adult: Secondary | ICD-10-CM | POA: Diagnosis not present

## 2019-12-23 DIAGNOSIS — E782 Mixed hyperlipidemia: Secondary | ICD-10-CM | POA: Diagnosis not present

## 2019-12-23 DIAGNOSIS — D0511 Intraductal carcinoma in situ of right breast: Secondary | ICD-10-CM | POA: Diagnosis not present

## 2019-12-23 DIAGNOSIS — Z8542 Personal history of malignant neoplasm of other parts of uterus: Secondary | ICD-10-CM | POA: Diagnosis not present

## 2019-12-23 DIAGNOSIS — E785 Hyperlipidemia, unspecified: Secondary | ICD-10-CM | POA: Diagnosis not present

## 2019-12-23 DIAGNOSIS — E1122 Type 2 diabetes mellitus with diabetic chronic kidney disease: Secondary | ICD-10-CM | POA: Diagnosis not present

## 2019-12-23 DIAGNOSIS — D0512 Intraductal carcinoma in situ of left breast: Secondary | ICD-10-CM | POA: Diagnosis not present

## 2019-12-23 DIAGNOSIS — E1142 Type 2 diabetes mellitus with diabetic polyneuropathy: Secondary | ICD-10-CM | POA: Diagnosis not present

## 2019-12-23 DIAGNOSIS — N183 Chronic kidney disease, stage 3 unspecified: Secondary | ICD-10-CM | POA: Diagnosis not present

## 2019-12-23 DIAGNOSIS — I10 Essential (primary) hypertension: Secondary | ICD-10-CM | POA: Diagnosis not present

## 2020-01-06 DIAGNOSIS — B351 Tinea unguium: Secondary | ICD-10-CM | POA: Diagnosis not present

## 2020-01-06 DIAGNOSIS — E1142 Type 2 diabetes mellitus with diabetic polyneuropathy: Secondary | ICD-10-CM | POA: Diagnosis not present

## 2020-02-02 DIAGNOSIS — I10 Essential (primary) hypertension: Secondary | ICD-10-CM | POA: Diagnosis not present

## 2020-02-02 DIAGNOSIS — D0512 Intraductal carcinoma in situ of left breast: Secondary | ICD-10-CM | POA: Diagnosis not present

## 2020-02-02 DIAGNOSIS — D0511 Intraductal carcinoma in situ of right breast: Secondary | ICD-10-CM | POA: Diagnosis not present

## 2020-02-02 DIAGNOSIS — E1142 Type 2 diabetes mellitus with diabetic polyneuropathy: Secondary | ICD-10-CM | POA: Diagnosis not present

## 2020-02-02 DIAGNOSIS — E785 Hyperlipidemia, unspecified: Secondary | ICD-10-CM | POA: Diagnosis not present

## 2020-02-02 DIAGNOSIS — N183 Chronic kidney disease, stage 3 unspecified: Secondary | ICD-10-CM | POA: Diagnosis not present

## 2020-02-02 DIAGNOSIS — E1122 Type 2 diabetes mellitus with diabetic chronic kidney disease: Secondary | ICD-10-CM | POA: Diagnosis not present

## 2020-02-02 DIAGNOSIS — E782 Mixed hyperlipidemia: Secondary | ICD-10-CM | POA: Diagnosis not present

## 2020-02-02 DIAGNOSIS — Z8542 Personal history of malignant neoplasm of other parts of uterus: Secondary | ICD-10-CM | POA: Diagnosis not present

## 2020-02-17 DIAGNOSIS — Z9011 Acquired absence of right breast and nipple: Secondary | ICD-10-CM | POA: Diagnosis not present

## 2020-02-17 DIAGNOSIS — Z7984 Long term (current) use of oral hypoglycemic drugs: Secondary | ICD-10-CM | POA: Diagnosis not present

## 2020-02-17 DIAGNOSIS — M1990 Unspecified osteoarthritis, unspecified site: Secondary | ICD-10-CM | POA: Diagnosis not present

## 2020-02-17 DIAGNOSIS — Z87898 Personal history of other specified conditions: Secondary | ICD-10-CM | POA: Diagnosis not present

## 2020-02-17 DIAGNOSIS — I129 Hypertensive chronic kidney disease with stage 1 through stage 4 chronic kidney disease, or unspecified chronic kidney disease: Secondary | ICD-10-CM | POA: Diagnosis not present

## 2020-02-17 DIAGNOSIS — D0511 Intraductal carcinoma in situ of right breast: Secondary | ICD-10-CM | POA: Diagnosis not present

## 2020-02-17 DIAGNOSIS — N189 Chronic kidney disease, unspecified: Secondary | ICD-10-CM | POA: Diagnosis not present

## 2020-02-17 DIAGNOSIS — Z8542 Personal history of malignant neoplasm of other parts of uterus: Secondary | ICD-10-CM | POA: Diagnosis not present

## 2020-02-17 DIAGNOSIS — Z7982 Long term (current) use of aspirin: Secondary | ICD-10-CM | POA: Diagnosis not present

## 2020-02-17 DIAGNOSIS — G709 Myoneural disorder, unspecified: Secondary | ICD-10-CM | POA: Diagnosis not present

## 2020-02-17 DIAGNOSIS — Z9189 Other specified personal risk factors, not elsewhere classified: Secondary | ICD-10-CM | POA: Diagnosis not present

## 2020-02-17 DIAGNOSIS — Z79899 Other long term (current) drug therapy: Secondary | ICD-10-CM | POA: Diagnosis not present

## 2020-02-17 DIAGNOSIS — I10 Essential (primary) hypertension: Secondary | ICD-10-CM | POA: Diagnosis not present

## 2020-02-17 DIAGNOSIS — Z79811 Long term (current) use of aromatase inhibitors: Secondary | ICD-10-CM | POA: Diagnosis not present

## 2020-02-17 DIAGNOSIS — E1122 Type 2 diabetes mellitus with diabetic chronic kidney disease: Secondary | ICD-10-CM | POA: Diagnosis not present

## 2020-02-17 DIAGNOSIS — Z5181 Encounter for therapeutic drug level monitoring: Secondary | ICD-10-CM | POA: Diagnosis not present

## 2020-02-17 DIAGNOSIS — C541 Malignant neoplasm of endometrium: Secondary | ICD-10-CM | POA: Diagnosis not present

## 2020-03-10 DIAGNOSIS — E785 Hyperlipidemia, unspecified: Secondary | ICD-10-CM | POA: Diagnosis not present

## 2020-03-10 DIAGNOSIS — E1122 Type 2 diabetes mellitus with diabetic chronic kidney disease: Secondary | ICD-10-CM | POA: Diagnosis not present

## 2020-03-10 DIAGNOSIS — I10 Essential (primary) hypertension: Secondary | ICD-10-CM | POA: Diagnosis not present

## 2020-03-10 DIAGNOSIS — Z8542 Personal history of malignant neoplasm of other parts of uterus: Secondary | ICD-10-CM | POA: Diagnosis not present

## 2020-03-10 DIAGNOSIS — N183 Chronic kidney disease, stage 3 unspecified: Secondary | ICD-10-CM | POA: Diagnosis not present

## 2020-03-10 DIAGNOSIS — D0512 Intraductal carcinoma in situ of left breast: Secondary | ICD-10-CM | POA: Diagnosis not present

## 2020-03-10 DIAGNOSIS — E1142 Type 2 diabetes mellitus with diabetic polyneuropathy: Secondary | ICD-10-CM | POA: Diagnosis not present

## 2020-03-10 DIAGNOSIS — E782 Mixed hyperlipidemia: Secondary | ICD-10-CM | POA: Diagnosis not present

## 2020-03-10 DIAGNOSIS — D0511 Intraductal carcinoma in situ of right breast: Secondary | ICD-10-CM | POA: Diagnosis not present

## 2020-03-13 DIAGNOSIS — I5189 Other ill-defined heart diseases: Secondary | ICD-10-CM | POA: Diagnosis not present

## 2020-03-20 DIAGNOSIS — D0511 Intraductal carcinoma in situ of right breast: Secondary | ICD-10-CM | POA: Diagnosis not present

## 2020-04-09 DIAGNOSIS — I10 Essential (primary) hypertension: Secondary | ICD-10-CM | POA: Diagnosis not present

## 2020-04-09 DIAGNOSIS — E785 Hyperlipidemia, unspecified: Secondary | ICD-10-CM | POA: Diagnosis not present

## 2020-04-09 DIAGNOSIS — E1122 Type 2 diabetes mellitus with diabetic chronic kidney disease: Secondary | ICD-10-CM | POA: Diagnosis not present

## 2020-04-09 DIAGNOSIS — E1142 Type 2 diabetes mellitus with diabetic polyneuropathy: Secondary | ICD-10-CM | POA: Diagnosis not present

## 2020-04-09 DIAGNOSIS — N183 Chronic kidney disease, stage 3 unspecified: Secondary | ICD-10-CM | POA: Diagnosis not present

## 2020-04-09 DIAGNOSIS — D0511 Intraductal carcinoma in situ of right breast: Secondary | ICD-10-CM | POA: Diagnosis not present

## 2020-04-09 DIAGNOSIS — E782 Mixed hyperlipidemia: Secondary | ICD-10-CM | POA: Diagnosis not present

## 2020-04-09 DIAGNOSIS — Z8542 Personal history of malignant neoplasm of other parts of uterus: Secondary | ICD-10-CM | POA: Diagnosis not present

## 2020-04-09 DIAGNOSIS — D0512 Intraductal carcinoma in situ of left breast: Secondary | ICD-10-CM | POA: Diagnosis not present

## 2020-04-16 DIAGNOSIS — Z23 Encounter for immunization: Secondary | ICD-10-CM | POA: Diagnosis not present

## 2020-05-07 DIAGNOSIS — H524 Presbyopia: Secondary | ICD-10-CM | POA: Diagnosis not present

## 2020-05-07 DIAGNOSIS — Z961 Presence of intraocular lens: Secondary | ICD-10-CM | POA: Diagnosis not present

## 2020-05-07 DIAGNOSIS — E119 Type 2 diabetes mellitus without complications: Secondary | ICD-10-CM | POA: Diagnosis not present

## 2020-05-10 DIAGNOSIS — I10 Essential (primary) hypertension: Secondary | ICD-10-CM | POA: Diagnosis not present

## 2020-05-10 DIAGNOSIS — E1142 Type 2 diabetes mellitus with diabetic polyneuropathy: Secondary | ICD-10-CM | POA: Diagnosis not present

## 2020-05-10 DIAGNOSIS — D0511 Intraductal carcinoma in situ of right breast: Secondary | ICD-10-CM | POA: Diagnosis not present

## 2020-05-10 DIAGNOSIS — E782 Mixed hyperlipidemia: Secondary | ICD-10-CM | POA: Diagnosis not present

## 2020-05-10 DIAGNOSIS — E785 Hyperlipidemia, unspecified: Secondary | ICD-10-CM | POA: Diagnosis not present

## 2020-05-10 DIAGNOSIS — D0512 Intraductal carcinoma in situ of left breast: Secondary | ICD-10-CM | POA: Diagnosis not present

## 2020-05-10 DIAGNOSIS — Z8542 Personal history of malignant neoplasm of other parts of uterus: Secondary | ICD-10-CM | POA: Diagnosis not present

## 2020-05-10 DIAGNOSIS — E1122 Type 2 diabetes mellitus with diabetic chronic kidney disease: Secondary | ICD-10-CM | POA: Diagnosis not present

## 2020-05-10 DIAGNOSIS — N183 Chronic kidney disease, stage 3 unspecified: Secondary | ICD-10-CM | POA: Diagnosis not present

## 2020-05-18 DIAGNOSIS — E1142 Type 2 diabetes mellitus with diabetic polyneuropathy: Secondary | ICD-10-CM | POA: Diagnosis not present

## 2020-05-18 DIAGNOSIS — B351 Tinea unguium: Secondary | ICD-10-CM | POA: Diagnosis not present

## 2020-06-02 DIAGNOSIS — E1142 Type 2 diabetes mellitus with diabetic polyneuropathy: Secondary | ICD-10-CM | POA: Diagnosis not present

## 2020-06-02 DIAGNOSIS — Z7984 Long term (current) use of oral hypoglycemic drugs: Secondary | ICD-10-CM | POA: Diagnosis not present

## 2020-06-02 DIAGNOSIS — E1122 Type 2 diabetes mellitus with diabetic chronic kidney disease: Secondary | ICD-10-CM | POA: Diagnosis not present

## 2020-06-09 DIAGNOSIS — N183 Chronic kidney disease, stage 3 unspecified: Secondary | ICD-10-CM | POA: Diagnosis not present

## 2020-06-09 DIAGNOSIS — E785 Hyperlipidemia, unspecified: Secondary | ICD-10-CM | POA: Diagnosis not present

## 2020-06-09 DIAGNOSIS — E782 Mixed hyperlipidemia: Secondary | ICD-10-CM | POA: Diagnosis not present

## 2020-06-09 DIAGNOSIS — D0512 Intraductal carcinoma in situ of left breast: Secondary | ICD-10-CM | POA: Diagnosis not present

## 2020-06-09 DIAGNOSIS — G8929 Other chronic pain: Secondary | ICD-10-CM | POA: Diagnosis not present

## 2020-06-09 DIAGNOSIS — E1142 Type 2 diabetes mellitus with diabetic polyneuropathy: Secondary | ICD-10-CM | POA: Diagnosis not present

## 2020-06-09 DIAGNOSIS — D0511 Intraductal carcinoma in situ of right breast: Secondary | ICD-10-CM | POA: Diagnosis not present

## 2020-06-09 DIAGNOSIS — E1122 Type 2 diabetes mellitus with diabetic chronic kidney disease: Secondary | ICD-10-CM | POA: Diagnosis not present

## 2020-06-09 DIAGNOSIS — I10 Essential (primary) hypertension: Secondary | ICD-10-CM | POA: Diagnosis not present

## 2020-06-09 DIAGNOSIS — Z8542 Personal history of malignant neoplasm of other parts of uterus: Secondary | ICD-10-CM | POA: Diagnosis not present

## 2020-06-26 DIAGNOSIS — Z170 Estrogen receptor positive status [ER+]: Secondary | ICD-10-CM | POA: Diagnosis not present

## 2020-06-26 DIAGNOSIS — Z9189 Other specified personal risk factors, not elsewhere classified: Secondary | ICD-10-CM | POA: Diagnosis not present

## 2020-06-26 DIAGNOSIS — D0511 Intraductal carcinoma in situ of right breast: Secondary | ICD-10-CM | POA: Diagnosis not present

## 2020-06-26 DIAGNOSIS — Z79811 Long term (current) use of aromatase inhibitors: Secondary | ICD-10-CM | POA: Diagnosis not present

## 2020-07-10 DIAGNOSIS — N183 Chronic kidney disease, stage 3 unspecified: Secondary | ICD-10-CM | POA: Diagnosis not present

## 2020-07-10 DIAGNOSIS — E782 Mixed hyperlipidemia: Secondary | ICD-10-CM | POA: Diagnosis not present

## 2020-07-10 DIAGNOSIS — E785 Hyperlipidemia, unspecified: Secondary | ICD-10-CM | POA: Diagnosis not present

## 2020-07-10 DIAGNOSIS — Z8542 Personal history of malignant neoplasm of other parts of uterus: Secondary | ICD-10-CM | POA: Diagnosis not present

## 2020-07-10 DIAGNOSIS — E1122 Type 2 diabetes mellitus with diabetic chronic kidney disease: Secondary | ICD-10-CM | POA: Diagnosis not present

## 2020-07-10 DIAGNOSIS — G8929 Other chronic pain: Secondary | ICD-10-CM | POA: Diagnosis not present

## 2020-07-10 DIAGNOSIS — I10 Essential (primary) hypertension: Secondary | ICD-10-CM | POA: Diagnosis not present

## 2020-07-10 DIAGNOSIS — E1142 Type 2 diabetes mellitus with diabetic polyneuropathy: Secondary | ICD-10-CM | POA: Diagnosis not present

## 2020-07-10 DIAGNOSIS — D0512 Intraductal carcinoma in situ of left breast: Secondary | ICD-10-CM | POA: Diagnosis not present

## 2020-07-10 DIAGNOSIS — D0511 Intraductal carcinoma in situ of right breast: Secondary | ICD-10-CM | POA: Diagnosis not present

## 2020-08-13 DIAGNOSIS — N183 Chronic kidney disease, stage 3 unspecified: Secondary | ICD-10-CM | POA: Diagnosis not present

## 2020-08-13 DIAGNOSIS — E782 Mixed hyperlipidemia: Secondary | ICD-10-CM | POA: Diagnosis not present

## 2020-08-13 DIAGNOSIS — E1122 Type 2 diabetes mellitus with diabetic chronic kidney disease: Secondary | ICD-10-CM | POA: Diagnosis not present

## 2020-08-13 DIAGNOSIS — E1142 Type 2 diabetes mellitus with diabetic polyneuropathy: Secondary | ICD-10-CM | POA: Diagnosis not present

## 2020-08-13 DIAGNOSIS — I10 Essential (primary) hypertension: Secondary | ICD-10-CM | POA: Diagnosis not present

## 2020-08-13 DIAGNOSIS — G8929 Other chronic pain: Secondary | ICD-10-CM | POA: Diagnosis not present

## 2020-10-01 DIAGNOSIS — I10 Essential (primary) hypertension: Secondary | ICD-10-CM | POA: Diagnosis not present

## 2020-10-01 DIAGNOSIS — Z7984 Long term (current) use of oral hypoglycemic drugs: Secondary | ICD-10-CM | POA: Diagnosis not present

## 2020-10-01 DIAGNOSIS — E1142 Type 2 diabetes mellitus with diabetic polyneuropathy: Secondary | ICD-10-CM | POA: Diagnosis not present

## 2020-10-30 DIAGNOSIS — E1142 Type 2 diabetes mellitus with diabetic polyneuropathy: Secondary | ICD-10-CM | POA: Diagnosis not present

## 2020-10-30 DIAGNOSIS — B351 Tinea unguium: Secondary | ICD-10-CM | POA: Diagnosis not present

## 2020-11-07 DIAGNOSIS — E1122 Type 2 diabetes mellitus with diabetic chronic kidney disease: Secondary | ICD-10-CM | POA: Diagnosis not present

## 2020-11-07 DIAGNOSIS — E1142 Type 2 diabetes mellitus with diabetic polyneuropathy: Secondary | ICD-10-CM | POA: Diagnosis not present

## 2020-11-07 DIAGNOSIS — I10 Essential (primary) hypertension: Secondary | ICD-10-CM | POA: Diagnosis not present

## 2020-11-07 DIAGNOSIS — G8929 Other chronic pain: Secondary | ICD-10-CM | POA: Diagnosis not present

## 2020-11-07 DIAGNOSIS — E782 Mixed hyperlipidemia: Secondary | ICD-10-CM | POA: Diagnosis not present

## 2020-11-07 DIAGNOSIS — N183 Chronic kidney disease, stage 3 unspecified: Secondary | ICD-10-CM | POA: Diagnosis not present

## 2020-11-18 DIAGNOSIS — D0512 Intraductal carcinoma in situ of left breast: Secondary | ICD-10-CM | POA: Diagnosis not present

## 2020-11-18 DIAGNOSIS — I10 Essential (primary) hypertension: Secondary | ICD-10-CM | POA: Diagnosis not present

## 2020-11-18 DIAGNOSIS — E1142 Type 2 diabetes mellitus with diabetic polyneuropathy: Secondary | ICD-10-CM | POA: Diagnosis not present

## 2020-11-18 DIAGNOSIS — G8929 Other chronic pain: Secondary | ICD-10-CM | POA: Diagnosis not present

## 2020-11-18 DIAGNOSIS — D0511 Intraductal carcinoma in situ of right breast: Secondary | ICD-10-CM | POA: Diagnosis not present

## 2020-11-18 DIAGNOSIS — N183 Chronic kidney disease, stage 3 unspecified: Secondary | ICD-10-CM | POA: Diagnosis not present

## 2020-11-18 DIAGNOSIS — E785 Hyperlipidemia, unspecified: Secondary | ICD-10-CM | POA: Diagnosis not present

## 2020-11-18 DIAGNOSIS — Z8542 Personal history of malignant neoplasm of other parts of uterus: Secondary | ICD-10-CM | POA: Diagnosis not present

## 2020-11-18 DIAGNOSIS — E782 Mixed hyperlipidemia: Secondary | ICD-10-CM | POA: Diagnosis not present

## 2020-11-18 DIAGNOSIS — E1122 Type 2 diabetes mellitus with diabetic chronic kidney disease: Secondary | ICD-10-CM | POA: Diagnosis not present

## 2020-12-21 DIAGNOSIS — E785 Hyperlipidemia, unspecified: Secondary | ICD-10-CM | POA: Diagnosis not present

## 2020-12-21 DIAGNOSIS — E1142 Type 2 diabetes mellitus with diabetic polyneuropathy: Secondary | ICD-10-CM | POA: Diagnosis not present

## 2020-12-21 DIAGNOSIS — N183 Chronic kidney disease, stage 3 unspecified: Secondary | ICD-10-CM | POA: Diagnosis not present

## 2020-12-21 DIAGNOSIS — E782 Mixed hyperlipidemia: Secondary | ICD-10-CM | POA: Diagnosis not present

## 2020-12-21 DIAGNOSIS — E1122 Type 2 diabetes mellitus with diabetic chronic kidney disease: Secondary | ICD-10-CM | POA: Diagnosis not present

## 2020-12-21 DIAGNOSIS — I10 Essential (primary) hypertension: Secondary | ICD-10-CM | POA: Diagnosis not present

## 2020-12-21 DIAGNOSIS — Z8542 Personal history of malignant neoplasm of other parts of uterus: Secondary | ICD-10-CM | POA: Diagnosis not present

## 2020-12-21 DIAGNOSIS — G8929 Other chronic pain: Secondary | ICD-10-CM | POA: Diagnosis not present

## 2020-12-21 DIAGNOSIS — D0511 Intraductal carcinoma in situ of right breast: Secondary | ICD-10-CM | POA: Diagnosis not present

## 2020-12-21 DIAGNOSIS — D0512 Intraductal carcinoma in situ of left breast: Secondary | ICD-10-CM | POA: Diagnosis not present

## 2020-12-24 DIAGNOSIS — Z7984 Long term (current) use of oral hypoglycemic drugs: Secondary | ICD-10-CM | POA: Diagnosis not present

## 2020-12-24 DIAGNOSIS — E1122 Type 2 diabetes mellitus with diabetic chronic kidney disease: Secondary | ICD-10-CM | POA: Diagnosis not present

## 2020-12-24 DIAGNOSIS — Z0000 Encounter for general adult medical examination without abnormal findings: Secondary | ICD-10-CM | POA: Diagnosis not present

## 2020-12-24 DIAGNOSIS — E1142 Type 2 diabetes mellitus with diabetic polyneuropathy: Secondary | ICD-10-CM | POA: Diagnosis not present

## 2020-12-24 DIAGNOSIS — E785 Hyperlipidemia, unspecified: Secondary | ICD-10-CM | POA: Diagnosis not present

## 2020-12-25 DIAGNOSIS — I10 Essential (primary) hypertension: Secondary | ICD-10-CM | POA: Diagnosis not present

## 2020-12-25 DIAGNOSIS — C541 Malignant neoplasm of endometrium: Secondary | ICD-10-CM | POA: Diagnosis not present

## 2020-12-25 DIAGNOSIS — Z9189 Other specified personal risk factors, not elsewhere classified: Secondary | ICD-10-CM | POA: Diagnosis not present

## 2020-12-25 DIAGNOSIS — D0511 Intraductal carcinoma in situ of right breast: Secondary | ICD-10-CM | POA: Diagnosis not present

## 2020-12-25 DIAGNOSIS — Z79811 Long term (current) use of aromatase inhibitors: Secondary | ICD-10-CM | POA: Diagnosis not present

## 2021-01-26 DIAGNOSIS — E1122 Type 2 diabetes mellitus with diabetic chronic kidney disease: Secondary | ICD-10-CM | POA: Diagnosis not present

## 2021-01-26 DIAGNOSIS — E1142 Type 2 diabetes mellitus with diabetic polyneuropathy: Secondary | ICD-10-CM | POA: Diagnosis not present

## 2021-01-26 DIAGNOSIS — I10 Essential (primary) hypertension: Secondary | ICD-10-CM | POA: Diagnosis not present

## 2021-01-26 DIAGNOSIS — E782 Mixed hyperlipidemia: Secondary | ICD-10-CM | POA: Diagnosis not present

## 2021-01-26 DIAGNOSIS — N183 Chronic kidney disease, stage 3 unspecified: Secondary | ICD-10-CM | POA: Diagnosis not present

## 2021-01-26 DIAGNOSIS — G8929 Other chronic pain: Secondary | ICD-10-CM | POA: Diagnosis not present

## 2021-02-17 DIAGNOSIS — E1122 Type 2 diabetes mellitus with diabetic chronic kidney disease: Secondary | ICD-10-CM | POA: Diagnosis not present

## 2021-02-17 DIAGNOSIS — E782 Mixed hyperlipidemia: Secondary | ICD-10-CM | POA: Diagnosis not present

## 2021-02-17 DIAGNOSIS — N183 Chronic kidney disease, stage 3 unspecified: Secondary | ICD-10-CM | POA: Diagnosis not present

## 2021-02-17 DIAGNOSIS — G8929 Other chronic pain: Secondary | ICD-10-CM | POA: Diagnosis not present

## 2021-02-17 DIAGNOSIS — I10 Essential (primary) hypertension: Secondary | ICD-10-CM | POA: Diagnosis not present

## 2021-02-17 DIAGNOSIS — E1142 Type 2 diabetes mellitus with diabetic polyneuropathy: Secondary | ICD-10-CM | POA: Diagnosis not present

## 2021-02-18 DIAGNOSIS — E1122 Type 2 diabetes mellitus with diabetic chronic kidney disease: Secondary | ICD-10-CM | POA: Diagnosis not present

## 2021-02-18 DIAGNOSIS — Z7984 Long term (current) use of oral hypoglycemic drugs: Secondary | ICD-10-CM | POA: Diagnosis not present

## 2021-03-05 DIAGNOSIS — E1142 Type 2 diabetes mellitus with diabetic polyneuropathy: Secondary | ICD-10-CM | POA: Diagnosis not present

## 2021-03-05 DIAGNOSIS — B351 Tinea unguium: Secondary | ICD-10-CM | POA: Diagnosis not present

## 2021-03-30 DIAGNOSIS — N183 Chronic kidney disease, stage 3 unspecified: Secondary | ICD-10-CM | POA: Diagnosis not present

## 2021-03-30 DIAGNOSIS — I10 Essential (primary) hypertension: Secondary | ICD-10-CM | POA: Diagnosis not present

## 2021-03-30 DIAGNOSIS — E1142 Type 2 diabetes mellitus with diabetic polyneuropathy: Secondary | ICD-10-CM | POA: Diagnosis not present

## 2021-03-30 DIAGNOSIS — E782 Mixed hyperlipidemia: Secondary | ICD-10-CM | POA: Diagnosis not present

## 2021-03-30 DIAGNOSIS — E1122 Type 2 diabetes mellitus with diabetic chronic kidney disease: Secondary | ICD-10-CM | POA: Diagnosis not present

## 2021-03-30 DIAGNOSIS — G8929 Other chronic pain: Secondary | ICD-10-CM | POA: Diagnosis not present

## 2021-04-02 DIAGNOSIS — D0511 Intraductal carcinoma in situ of right breast: Secondary | ICD-10-CM | POA: Diagnosis not present

## 2021-04-02 DIAGNOSIS — Z1231 Encounter for screening mammogram for malignant neoplasm of breast: Secondary | ICD-10-CM | POA: Diagnosis not present

## 2021-05-03 DIAGNOSIS — N183 Chronic kidney disease, stage 3 unspecified: Secondary | ICD-10-CM | POA: Diagnosis not present

## 2021-05-03 DIAGNOSIS — E1122 Type 2 diabetes mellitus with diabetic chronic kidney disease: Secondary | ICD-10-CM | POA: Diagnosis not present

## 2021-05-03 DIAGNOSIS — E1142 Type 2 diabetes mellitus with diabetic polyneuropathy: Secondary | ICD-10-CM | POA: Diagnosis not present

## 2021-05-03 DIAGNOSIS — E782 Mixed hyperlipidemia: Secondary | ICD-10-CM | POA: Diagnosis not present

## 2021-05-03 DIAGNOSIS — I10 Essential (primary) hypertension: Secondary | ICD-10-CM | POA: Diagnosis not present

## 2021-05-03 DIAGNOSIS — G8929 Other chronic pain: Secondary | ICD-10-CM | POA: Diagnosis not present

## 2021-05-14 DIAGNOSIS — H524 Presbyopia: Secondary | ICD-10-CM | POA: Diagnosis not present

## 2021-05-14 DIAGNOSIS — E119 Type 2 diabetes mellitus without complications: Secondary | ICD-10-CM | POA: Diagnosis not present

## 2021-07-06 DIAGNOSIS — G8929 Other chronic pain: Secondary | ICD-10-CM | POA: Diagnosis not present

## 2021-07-06 DIAGNOSIS — E1142 Type 2 diabetes mellitus with diabetic polyneuropathy: Secondary | ICD-10-CM | POA: Diagnosis not present

## 2021-07-06 DIAGNOSIS — E782 Mixed hyperlipidemia: Secondary | ICD-10-CM | POA: Diagnosis not present

## 2021-07-06 DIAGNOSIS — E1122 Type 2 diabetes mellitus with diabetic chronic kidney disease: Secondary | ICD-10-CM | POA: Diagnosis not present

## 2021-07-06 DIAGNOSIS — N183 Chronic kidney disease, stage 3 unspecified: Secondary | ICD-10-CM | POA: Diagnosis not present

## 2021-07-06 DIAGNOSIS — I10 Essential (primary) hypertension: Secondary | ICD-10-CM | POA: Diagnosis not present

## 2021-07-23 DIAGNOSIS — Z9189 Other specified personal risk factors, not elsewhere classified: Secondary | ICD-10-CM | POA: Diagnosis not present

## 2021-07-23 DIAGNOSIS — N1832 Chronic kidney disease, stage 3b: Secondary | ICD-10-CM | POA: Diagnosis not present

## 2021-07-23 DIAGNOSIS — Z79811 Long term (current) use of aromatase inhibitors: Secondary | ICD-10-CM | POA: Diagnosis not present

## 2021-07-23 DIAGNOSIS — D0511 Intraductal carcinoma in situ of right breast: Secondary | ICD-10-CM | POA: Diagnosis not present

## 2021-07-23 DIAGNOSIS — I10 Essential (primary) hypertension: Secondary | ICD-10-CM | POA: Diagnosis not present

## 2021-09-17 DIAGNOSIS — E1142 Type 2 diabetes mellitus with diabetic polyneuropathy: Secondary | ICD-10-CM | POA: Diagnosis not present

## 2021-09-17 DIAGNOSIS — B351 Tinea unguium: Secondary | ICD-10-CM | POA: Diagnosis not present

## 2021-09-20 DIAGNOSIS — M5442 Lumbago with sciatica, left side: Secondary | ICD-10-CM | POA: Diagnosis not present

## 2021-09-27 DIAGNOSIS — E1142 Type 2 diabetes mellitus with diabetic polyneuropathy: Secondary | ICD-10-CM | POA: Diagnosis not present

## 2021-09-27 DIAGNOSIS — G8929 Other chronic pain: Secondary | ICD-10-CM | POA: Diagnosis not present

## 2021-09-27 DIAGNOSIS — I10 Essential (primary) hypertension: Secondary | ICD-10-CM | POA: Diagnosis not present

## 2021-09-27 DIAGNOSIS — N183 Chronic kidney disease, stage 3 unspecified: Secondary | ICD-10-CM | POA: Diagnosis not present

## 2021-09-27 DIAGNOSIS — E782 Mixed hyperlipidemia: Secondary | ICD-10-CM | POA: Diagnosis not present

## 2021-09-29 DIAGNOSIS — I10 Essential (primary) hypertension: Secondary | ICD-10-CM | POA: Diagnosis not present

## 2021-09-29 DIAGNOSIS — S76012D Strain of muscle, fascia and tendon of left hip, subsequent encounter: Secondary | ICD-10-CM | POA: Diagnosis not present

## 2021-10-04 DIAGNOSIS — M25652 Stiffness of left hip, not elsewhere classified: Secondary | ICD-10-CM | POA: Diagnosis not present

## 2021-10-04 DIAGNOSIS — M25352 Other instability, left hip: Secondary | ICD-10-CM | POA: Diagnosis not present

## 2021-10-04 DIAGNOSIS — R531 Weakness: Secondary | ICD-10-CM | POA: Diagnosis not present

## 2021-10-04 DIAGNOSIS — M25552 Pain in left hip: Secondary | ICD-10-CM | POA: Diagnosis not present

## 2021-10-07 DIAGNOSIS — M25352 Other instability, left hip: Secondary | ICD-10-CM | POA: Diagnosis not present

## 2021-10-07 DIAGNOSIS — R531 Weakness: Secondary | ICD-10-CM | POA: Diagnosis not present

## 2021-10-07 DIAGNOSIS — M25652 Stiffness of left hip, not elsewhere classified: Secondary | ICD-10-CM | POA: Diagnosis not present

## 2021-10-07 DIAGNOSIS — M25552 Pain in left hip: Secondary | ICD-10-CM | POA: Diagnosis not present

## 2021-10-12 DIAGNOSIS — M25552 Pain in left hip: Secondary | ICD-10-CM | POA: Diagnosis not present

## 2021-10-12 DIAGNOSIS — M25352 Other instability, left hip: Secondary | ICD-10-CM | POA: Diagnosis not present

## 2021-10-12 DIAGNOSIS — R531 Weakness: Secondary | ICD-10-CM | POA: Diagnosis not present

## 2021-10-12 DIAGNOSIS — M25652 Stiffness of left hip, not elsewhere classified: Secondary | ICD-10-CM | POA: Diagnosis not present

## 2021-10-13 DIAGNOSIS — I10 Essential (primary) hypertension: Secondary | ICD-10-CM | POA: Diagnosis not present

## 2021-10-14 DIAGNOSIS — M25652 Stiffness of left hip, not elsewhere classified: Secondary | ICD-10-CM | POA: Diagnosis not present

## 2021-10-14 DIAGNOSIS — M25552 Pain in left hip: Secondary | ICD-10-CM | POA: Diagnosis not present

## 2021-10-14 DIAGNOSIS — M25352 Other instability, left hip: Secondary | ICD-10-CM | POA: Diagnosis not present

## 2021-10-14 DIAGNOSIS — R531 Weakness: Secondary | ICD-10-CM | POA: Diagnosis not present

## 2021-10-19 DIAGNOSIS — R531 Weakness: Secondary | ICD-10-CM | POA: Diagnosis not present

## 2021-10-19 DIAGNOSIS — M25552 Pain in left hip: Secondary | ICD-10-CM | POA: Diagnosis not present

## 2021-10-19 DIAGNOSIS — M25652 Stiffness of left hip, not elsewhere classified: Secondary | ICD-10-CM | POA: Diagnosis not present

## 2021-10-19 DIAGNOSIS — M25352 Other instability, left hip: Secondary | ICD-10-CM | POA: Diagnosis not present

## 2021-10-21 DIAGNOSIS — R531 Weakness: Secondary | ICD-10-CM | POA: Diagnosis not present

## 2021-10-21 DIAGNOSIS — M25652 Stiffness of left hip, not elsewhere classified: Secondary | ICD-10-CM | POA: Diagnosis not present

## 2021-10-21 DIAGNOSIS — M25352 Other instability, left hip: Secondary | ICD-10-CM | POA: Diagnosis not present

## 2021-10-21 DIAGNOSIS — M25552 Pain in left hip: Secondary | ICD-10-CM | POA: Diagnosis not present

## 2021-10-26 DIAGNOSIS — M25352 Other instability, left hip: Secondary | ICD-10-CM | POA: Diagnosis not present

## 2021-10-26 DIAGNOSIS — R531 Weakness: Secondary | ICD-10-CM | POA: Diagnosis not present

## 2021-10-26 DIAGNOSIS — M25652 Stiffness of left hip, not elsewhere classified: Secondary | ICD-10-CM | POA: Diagnosis not present

## 2021-10-26 DIAGNOSIS — M25552 Pain in left hip: Secondary | ICD-10-CM | POA: Diagnosis not present

## 2021-10-28 DIAGNOSIS — M25352 Other instability, left hip: Secondary | ICD-10-CM | POA: Diagnosis not present

## 2021-10-28 DIAGNOSIS — M25652 Stiffness of left hip, not elsewhere classified: Secondary | ICD-10-CM | POA: Diagnosis not present

## 2021-10-28 DIAGNOSIS — M25552 Pain in left hip: Secondary | ICD-10-CM | POA: Diagnosis not present

## 2021-10-28 DIAGNOSIS — R531 Weakness: Secondary | ICD-10-CM | POA: Diagnosis not present

## 2021-11-02 DIAGNOSIS — R531 Weakness: Secondary | ICD-10-CM | POA: Diagnosis not present

## 2021-11-02 DIAGNOSIS — M25652 Stiffness of left hip, not elsewhere classified: Secondary | ICD-10-CM | POA: Diagnosis not present

## 2021-11-02 DIAGNOSIS — M25552 Pain in left hip: Secondary | ICD-10-CM | POA: Diagnosis not present

## 2021-11-02 DIAGNOSIS — M25352 Other instability, left hip: Secondary | ICD-10-CM | POA: Diagnosis not present

## 2021-11-03 DIAGNOSIS — R531 Weakness: Secondary | ICD-10-CM | POA: Diagnosis not present

## 2021-11-03 DIAGNOSIS — M25352 Other instability, left hip: Secondary | ICD-10-CM | POA: Diagnosis not present

## 2021-11-03 DIAGNOSIS — M25652 Stiffness of left hip, not elsewhere classified: Secondary | ICD-10-CM | POA: Diagnosis not present

## 2021-11-03 DIAGNOSIS — M25552 Pain in left hip: Secondary | ICD-10-CM | POA: Diagnosis not present

## 2021-11-04 DIAGNOSIS — N183 Chronic kidney disease, stage 3 unspecified: Secondary | ICD-10-CM | POA: Diagnosis not present

## 2021-11-04 DIAGNOSIS — E1122 Type 2 diabetes mellitus with diabetic chronic kidney disease: Secondary | ICD-10-CM | POA: Diagnosis not present

## 2021-11-04 DIAGNOSIS — E782 Mixed hyperlipidemia: Secondary | ICD-10-CM | POA: Diagnosis not present

## 2021-11-04 DIAGNOSIS — I10 Essential (primary) hypertension: Secondary | ICD-10-CM | POA: Diagnosis not present

## 2021-11-04 DIAGNOSIS — G8929 Other chronic pain: Secondary | ICD-10-CM | POA: Diagnosis not present

## 2021-11-08 DIAGNOSIS — M25352 Other instability, left hip: Secondary | ICD-10-CM | POA: Diagnosis not present

## 2021-11-08 DIAGNOSIS — M25652 Stiffness of left hip, not elsewhere classified: Secondary | ICD-10-CM | POA: Diagnosis not present

## 2021-11-08 DIAGNOSIS — M25552 Pain in left hip: Secondary | ICD-10-CM | POA: Diagnosis not present

## 2021-11-08 DIAGNOSIS — R531 Weakness: Secondary | ICD-10-CM | POA: Diagnosis not present

## 2021-11-09 DIAGNOSIS — M25652 Stiffness of left hip, not elsewhere classified: Secondary | ICD-10-CM | POA: Diagnosis not present

## 2021-11-09 DIAGNOSIS — M25552 Pain in left hip: Secondary | ICD-10-CM | POA: Diagnosis not present

## 2021-11-09 DIAGNOSIS — M25352 Other instability, left hip: Secondary | ICD-10-CM | POA: Diagnosis not present

## 2021-11-09 DIAGNOSIS — R531 Weakness: Secondary | ICD-10-CM | POA: Diagnosis not present

## 2021-11-16 DIAGNOSIS — M25352 Other instability, left hip: Secondary | ICD-10-CM | POA: Diagnosis not present

## 2021-11-16 DIAGNOSIS — M25552 Pain in left hip: Secondary | ICD-10-CM | POA: Diagnosis not present

## 2021-11-16 DIAGNOSIS — M25652 Stiffness of left hip, not elsewhere classified: Secondary | ICD-10-CM | POA: Diagnosis not present

## 2021-11-16 DIAGNOSIS — R531 Weakness: Secondary | ICD-10-CM | POA: Diagnosis not present

## 2021-11-17 DIAGNOSIS — R531 Weakness: Secondary | ICD-10-CM | POA: Diagnosis not present

## 2021-11-17 DIAGNOSIS — M25652 Stiffness of left hip, not elsewhere classified: Secondary | ICD-10-CM | POA: Diagnosis not present

## 2021-11-17 DIAGNOSIS — M25552 Pain in left hip: Secondary | ICD-10-CM | POA: Diagnosis not present

## 2021-11-17 DIAGNOSIS — M25352 Other instability, left hip: Secondary | ICD-10-CM | POA: Diagnosis not present

## 2021-11-22 DIAGNOSIS — M25652 Stiffness of left hip, not elsewhere classified: Secondary | ICD-10-CM | POA: Diagnosis not present

## 2021-11-22 DIAGNOSIS — R531 Weakness: Secondary | ICD-10-CM | POA: Diagnosis not present

## 2021-11-22 DIAGNOSIS — M25552 Pain in left hip: Secondary | ICD-10-CM | POA: Diagnosis not present

## 2021-11-22 DIAGNOSIS — M25352 Other instability, left hip: Secondary | ICD-10-CM | POA: Diagnosis not present

## 2021-11-25 DIAGNOSIS — M25652 Stiffness of left hip, not elsewhere classified: Secondary | ICD-10-CM | POA: Diagnosis not present

## 2021-11-25 DIAGNOSIS — R531 Weakness: Secondary | ICD-10-CM | POA: Diagnosis not present

## 2021-11-25 DIAGNOSIS — M25352 Other instability, left hip: Secondary | ICD-10-CM | POA: Diagnosis not present

## 2021-11-25 DIAGNOSIS — M25552 Pain in left hip: Secondary | ICD-10-CM | POA: Diagnosis not present

## 2021-11-30 DIAGNOSIS — M25652 Stiffness of left hip, not elsewhere classified: Secondary | ICD-10-CM | POA: Diagnosis not present

## 2021-11-30 DIAGNOSIS — M25552 Pain in left hip: Secondary | ICD-10-CM | POA: Diagnosis not present

## 2021-11-30 DIAGNOSIS — R531 Weakness: Secondary | ICD-10-CM | POA: Diagnosis not present

## 2021-11-30 DIAGNOSIS — M25352 Other instability, left hip: Secondary | ICD-10-CM | POA: Diagnosis not present

## 2021-12-02 DIAGNOSIS — R531 Weakness: Secondary | ICD-10-CM | POA: Diagnosis not present

## 2021-12-02 DIAGNOSIS — M25352 Other instability, left hip: Secondary | ICD-10-CM | POA: Diagnosis not present

## 2021-12-02 DIAGNOSIS — M25552 Pain in left hip: Secondary | ICD-10-CM | POA: Diagnosis not present

## 2021-12-02 DIAGNOSIS — M25652 Stiffness of left hip, not elsewhere classified: Secondary | ICD-10-CM | POA: Diagnosis not present

## 2021-12-07 DIAGNOSIS — M25652 Stiffness of left hip, not elsewhere classified: Secondary | ICD-10-CM | POA: Diagnosis not present

## 2021-12-07 DIAGNOSIS — R531 Weakness: Secondary | ICD-10-CM | POA: Diagnosis not present

## 2021-12-07 DIAGNOSIS — M25552 Pain in left hip: Secondary | ICD-10-CM | POA: Diagnosis not present

## 2021-12-07 DIAGNOSIS — M25352 Other instability, left hip: Secondary | ICD-10-CM | POA: Diagnosis not present

## 2021-12-29 DIAGNOSIS — N183 Chronic kidney disease, stage 3 unspecified: Secondary | ICD-10-CM | POA: Diagnosis not present

## 2021-12-29 DIAGNOSIS — Z0000 Encounter for general adult medical examination without abnormal findings: Secondary | ICD-10-CM | POA: Diagnosis not present

## 2021-12-29 DIAGNOSIS — R600 Localized edema: Secondary | ICD-10-CM | POA: Diagnosis not present

## 2021-12-29 DIAGNOSIS — D0511 Intraductal carcinoma in situ of right breast: Secondary | ICD-10-CM | POA: Diagnosis not present

## 2021-12-29 DIAGNOSIS — I10 Essential (primary) hypertension: Secondary | ICD-10-CM | POA: Diagnosis not present

## 2021-12-29 DIAGNOSIS — Z1211 Encounter for screening for malignant neoplasm of colon: Secondary | ICD-10-CM | POA: Diagnosis not present

## 2021-12-29 DIAGNOSIS — N1831 Chronic kidney disease, stage 3a: Secondary | ICD-10-CM | POA: Diagnosis not present

## 2021-12-29 DIAGNOSIS — E782 Mixed hyperlipidemia: Secondary | ICD-10-CM | POA: Diagnosis not present

## 2021-12-29 DIAGNOSIS — Z23 Encounter for immunization: Secondary | ICD-10-CM | POA: Diagnosis not present

## 2021-12-29 DIAGNOSIS — G8929 Other chronic pain: Secondary | ICD-10-CM | POA: Diagnosis not present

## 2021-12-29 DIAGNOSIS — Z13820 Encounter for screening for osteoporosis: Secondary | ICD-10-CM | POA: Diagnosis not present

## 2021-12-29 DIAGNOSIS — Z1231 Encounter for screening mammogram for malignant neoplasm of breast: Secondary | ICD-10-CM | POA: Diagnosis not present

## 2021-12-29 DIAGNOSIS — E1122 Type 2 diabetes mellitus with diabetic chronic kidney disease: Secondary | ICD-10-CM | POA: Diagnosis not present

## 2021-12-30 DIAGNOSIS — R600 Localized edema: Secondary | ICD-10-CM | POA: Diagnosis not present

## 2022-01-21 DIAGNOSIS — D0511 Intraductal carcinoma in situ of right breast: Secondary | ICD-10-CM | POA: Diagnosis not present

## 2022-01-21 DIAGNOSIS — Z9189 Other specified personal risk factors, not elsewhere classified: Secondary | ICD-10-CM | POA: Diagnosis not present

## 2022-01-21 DIAGNOSIS — I10 Essential (primary) hypertension: Secondary | ICD-10-CM | POA: Diagnosis not present

## 2022-01-21 DIAGNOSIS — N1832 Chronic kidney disease, stage 3b: Secondary | ICD-10-CM | POA: Diagnosis not present

## 2022-01-21 DIAGNOSIS — Z79811 Long term (current) use of aromatase inhibitors: Secondary | ICD-10-CM | POA: Diagnosis not present

## 2022-01-21 DIAGNOSIS — B351 Tinea unguium: Secondary | ICD-10-CM | POA: Diagnosis not present

## 2022-01-21 DIAGNOSIS — E1142 Type 2 diabetes mellitus with diabetic polyneuropathy: Secondary | ICD-10-CM | POA: Diagnosis not present

## 2022-01-26 DIAGNOSIS — I10 Essential (primary) hypertension: Secondary | ICD-10-CM | POA: Diagnosis not present

## 2022-01-26 DIAGNOSIS — E1122 Type 2 diabetes mellitus with diabetic chronic kidney disease: Secondary | ICD-10-CM | POA: Diagnosis not present

## 2022-01-26 DIAGNOSIS — N183 Chronic kidney disease, stage 3 unspecified: Secondary | ICD-10-CM | POA: Diagnosis not present

## 2022-03-03 DIAGNOSIS — I10 Essential (primary) hypertension: Secondary | ICD-10-CM | POA: Diagnosis not present

## 2022-03-03 DIAGNOSIS — E1122 Type 2 diabetes mellitus with diabetic chronic kidney disease: Secondary | ICD-10-CM | POA: Diagnosis not present

## 2022-03-03 DIAGNOSIS — G8929 Other chronic pain: Secondary | ICD-10-CM | POA: Diagnosis not present

## 2022-03-03 DIAGNOSIS — E782 Mixed hyperlipidemia: Secondary | ICD-10-CM | POA: Diagnosis not present

## 2022-03-03 DIAGNOSIS — N183 Chronic kidney disease, stage 3 unspecified: Secondary | ICD-10-CM | POA: Diagnosis not present

## 2022-04-04 DIAGNOSIS — E1122 Type 2 diabetes mellitus with diabetic chronic kidney disease: Secondary | ICD-10-CM | POA: Diagnosis not present

## 2022-04-04 DIAGNOSIS — G8929 Other chronic pain: Secondary | ICD-10-CM | POA: Diagnosis not present

## 2022-04-04 DIAGNOSIS — E782 Mixed hyperlipidemia: Secondary | ICD-10-CM | POA: Diagnosis not present

## 2022-04-04 DIAGNOSIS — I10 Essential (primary) hypertension: Secondary | ICD-10-CM | POA: Diagnosis not present

## 2022-04-04 DIAGNOSIS — N183 Chronic kidney disease, stage 3 unspecified: Secondary | ICD-10-CM | POA: Diagnosis not present

## 2022-04-13 DIAGNOSIS — I10 Essential (primary) hypertension: Secondary | ICD-10-CM | POA: Diagnosis not present

## 2022-04-13 DIAGNOSIS — N1832 Chronic kidney disease, stage 3b: Secondary | ICD-10-CM | POA: Diagnosis not present

## 2022-04-13 DIAGNOSIS — Z23 Encounter for immunization: Secondary | ICD-10-CM | POA: Diagnosis not present

## 2022-04-13 DIAGNOSIS — E1122 Type 2 diabetes mellitus with diabetic chronic kidney disease: Secondary | ICD-10-CM | POA: Diagnosis not present

## 2022-04-14 ENCOUNTER — Other Ambulatory Visit (HOSPITAL_COMMUNITY): Payer: Self-pay | Admitting: Family Medicine

## 2022-04-14 DIAGNOSIS — N1832 Chronic kidney disease, stage 3b: Secondary | ICD-10-CM

## 2022-04-25 ENCOUNTER — Encounter: Payer: Self-pay | Admitting: *Deleted

## 2022-04-25 ENCOUNTER — Telehealth: Payer: Self-pay | Admitting: *Deleted

## 2022-04-25 NOTE — Patient Outreach (Signed)
  Care Coordination   Initial Visit Note   04/25/2022 Name: Audrey [name] MRN: [id number] DOB: 04/26/1951  Audrey [name] is a 72 y.o. year old female who sees Audrey [name], Audrey So, MD for primary care. I spoke with  Audrey [name] by phone today.  What matters to the patients health and wellness today?  No needs    Goals Addressed               This Visit's Progress     COMPLETED: No needs (pt-stated)        Care Coordination Interventions: Advised patient to contact her provider's office to scheduled her AWV Reviewed medications with patient and discussed adherence with no needed refills Reviewed scheduled/upcoming provider appointments including pending appointments Assessed social determinant of health barriers         SDOH assessments and interventions completed:  Yes  SDOH Interventions Today    Flowsheet Row Most Recent Value  SDOH Interventions   Food Insecurity Interventions Intervention Not Indicated  Housing Interventions Intervention Not Indicated  Transportation Interventions Intervention Not Indicated  Utilities Interventions Intervention Not Indicated        Care Coordination Interventions Activated:  Yes  Care Coordination Interventions:  Yes, provided   Follow up plan: No further intervention required.   Encounter Outcome:  Pt. Visit Completed   Audrey Mina, RN Care Management Coordinator [place] Office [phone]

## 2022-04-25 NOTE — Patient Instructions (Signed)
Visit Information  Thank you for taking time to visit with me today. Please don't hesitate to contact me if I can be of assistance to you.   Following are the goals we discussed today:   Goals Addressed               This Visit's Progress     COMPLETED: No needs (pt-stated)        Care Coordination Interventions: Advised patient to contact her provider's office to scheduled her AWV Reviewed medications with patient and discussed adherence with no needed refills Reviewed scheduled/upcoming provider appointments including pending appointments Assessed social determinant of health barriers         Please call the care guide team at [phone] if you need to cancel or reschedule your appointment.   If you are experiencing a Mental Health or [place] or need someone to talk to, please call the Suicide and Crisis Lifeline: 988  The patient verbalized understanding of instructions, educational materials, and care plan provided today and DECLINED offer to receive copy of patient instructions, educational materials, and care plan.   No further follow up required: No needs at this time  Raina Mina, RN Care Management Coordinator [place] Office [phone]

## 2022-04-27 ENCOUNTER — Ambulatory Visit (HOSPITAL_COMMUNITY)
Admission: RE | Admit: 2022-04-27 | Discharge: 2022-04-27 | Disposition: A | Discharge status: Home / Self Care | Payer: PPO | Source: Ambulatory Visit | Attending: Family Medicine | Admitting: Family Medicine

## 2022-04-27 DIAGNOSIS — N1832 Chronic kidney disease, stage 3b: Secondary | ICD-10-CM | POA: Diagnosis not present

## 2022-04-27 DIAGNOSIS — N189 Chronic kidney disease, unspecified: Secondary | ICD-10-CM | POA: Diagnosis not present

## 2022-05-23 DIAGNOSIS — E119 Type 2 diabetes mellitus without complications: Secondary | ICD-10-CM | POA: Diagnosis not present

## 2022-05-23 DIAGNOSIS — H524 Presbyopia: Secondary | ICD-10-CM | POA: Diagnosis not present

## 2022-06-06 DIAGNOSIS — I5189 Other ill-defined heart diseases: Secondary | ICD-10-CM | POA: Diagnosis not present

## 2022-06-06 DIAGNOSIS — D631 Anemia in chronic kidney disease: Secondary | ICD-10-CM | POA: Diagnosis not present

## 2022-06-06 DIAGNOSIS — N189 Chronic kidney disease, unspecified: Secondary | ICD-10-CM | POA: Diagnosis not present

## 2022-06-06 DIAGNOSIS — N2581 Secondary hyperparathyroidism of renal origin: Secondary | ICD-10-CM | POA: Diagnosis not present

## 2022-06-06 DIAGNOSIS — E782 Mixed hyperlipidemia: Secondary | ICD-10-CM | POA: Diagnosis not present

## 2022-06-06 DIAGNOSIS — I129 Hypertensive chronic kidney disease with stage 1 through stage 4 chronic kidney disease, or unspecified chronic kidney disease: Secondary | ICD-10-CM | POA: Diagnosis not present

## 2022-06-06 DIAGNOSIS — I739 Peripheral vascular disease, unspecified: Secondary | ICD-10-CM | POA: Diagnosis not present

## 2022-06-06 DIAGNOSIS — E1122 Type 2 diabetes mellitus with diabetic chronic kidney disease: Secondary | ICD-10-CM | POA: Diagnosis not present

## 2022-06-06 DIAGNOSIS — N184 Chronic kidney disease, stage 4 (severe): Secondary | ICD-10-CM | POA: Diagnosis not present

## 2022-06-20 DIAGNOSIS — G8929 Other chronic pain: Secondary | ICD-10-CM | POA: Diagnosis not present

## 2022-06-20 DIAGNOSIS — E1122 Type 2 diabetes mellitus with diabetic chronic kidney disease: Secondary | ICD-10-CM | POA: Diagnosis not present

## 2022-06-20 DIAGNOSIS — I10 Essential (primary) hypertension: Secondary | ICD-10-CM | POA: Diagnosis not present

## 2022-06-20 DIAGNOSIS — E782 Mixed hyperlipidemia: Secondary | ICD-10-CM | POA: Diagnosis not present

## 2022-06-20 DIAGNOSIS — N183 Chronic kidney disease, stage 3 unspecified: Secondary | ICD-10-CM | POA: Diagnosis not present

## 2022-07-15 DIAGNOSIS — N184 Chronic kidney disease, stage 4 (severe): Secondary | ICD-10-CM | POA: Diagnosis not present

## 2022-07-15 DIAGNOSIS — I10 Essential (primary) hypertension: Secondary | ICD-10-CM | POA: Diagnosis not present

## 2022-07-15 DIAGNOSIS — E1122 Type 2 diabetes mellitus with diabetic chronic kidney disease: Secondary | ICD-10-CM | POA: Diagnosis not present

## 2022-07-22 DIAGNOSIS — M85832 Other specified disorders of bone density and structure, left forearm: Secondary | ICD-10-CM | POA: Diagnosis not present

## 2022-07-22 DIAGNOSIS — I10 Essential (primary) hypertension: Secondary | ICD-10-CM | POA: Diagnosis not present

## 2022-07-22 DIAGNOSIS — Z1231 Encounter for screening mammogram for malignant neoplasm of breast: Secondary | ICD-10-CM | POA: Diagnosis not present

## 2022-07-22 DIAGNOSIS — D0511 Intraductal carcinoma in situ of right breast: Secondary | ICD-10-CM | POA: Diagnosis not present

## 2022-07-22 DIAGNOSIS — R92322 Mammographic fibroglandular density, left breast: Secondary | ICD-10-CM | POA: Diagnosis not present

## 2022-07-22 DIAGNOSIS — M85852 Other specified disorders of bone density and structure, left thigh: Secondary | ICD-10-CM | POA: Diagnosis not present

## 2022-09-05 DIAGNOSIS — E1122 Type 2 diabetes mellitus with diabetic chronic kidney disease: Secondary | ICD-10-CM | POA: Diagnosis not present

## 2022-09-05 DIAGNOSIS — I739 Peripheral vascular disease, unspecified: Secondary | ICD-10-CM | POA: Diagnosis not present

## 2022-09-05 DIAGNOSIS — N184 Chronic kidney disease, stage 4 (severe): Secondary | ICD-10-CM | POA: Diagnosis not present

## 2022-09-05 DIAGNOSIS — I129 Hypertensive chronic kidney disease with stage 1 through stage 4 chronic kidney disease, or unspecified chronic kidney disease: Secondary | ICD-10-CM | POA: Diagnosis not present

## 2022-09-05 DIAGNOSIS — N2581 Secondary hyperparathyroidism of renal origin: Secondary | ICD-10-CM | POA: Diagnosis not present

## 2022-09-05 DIAGNOSIS — D631 Anemia in chronic kidney disease: Secondary | ICD-10-CM | POA: Diagnosis not present

## 2022-09-05 DIAGNOSIS — I5189 Other ill-defined heart diseases: Secondary | ICD-10-CM | POA: Diagnosis not present

## 2022-09-05 DIAGNOSIS — E782 Mixed hyperlipidemia: Secondary | ICD-10-CM | POA: Diagnosis not present

## 2022-09-12 DIAGNOSIS — C50911 Malignant neoplasm of unspecified site of right female breast: Secondary | ICD-10-CM | POA: Diagnosis not present

## 2022-09-19 DIAGNOSIS — R1013 Epigastric pain: Secondary | ICD-10-CM | POA: Diagnosis not present

## 2022-09-22 DIAGNOSIS — R748 Abnormal levels of other serum enzymes: Secondary | ICD-10-CM | POA: Diagnosis not present

## 2022-09-27 DIAGNOSIS — C50911 Malignant neoplasm of unspecified site of right female breast: Secondary | ICD-10-CM | POA: Diagnosis not present

## 2022-09-30 DIAGNOSIS — I129 Hypertensive chronic kidney disease with stage 1 through stage 4 chronic kidney disease, or unspecified chronic kidney disease: Secondary | ICD-10-CM | POA: Diagnosis not present

## 2022-09-30 DIAGNOSIS — I10 Essential (primary) hypertension: Secondary | ICD-10-CM | POA: Diagnosis not present

## 2022-09-30 DIAGNOSIS — N1832 Chronic kidney disease, stage 3b: Secondary | ICD-10-CM | POA: Diagnosis not present

## 2022-09-30 DIAGNOSIS — D0511 Intraductal carcinoma in situ of right breast: Secondary | ICD-10-CM | POA: Diagnosis not present

## 2022-09-30 DIAGNOSIS — Z79811 Long term (current) use of aromatase inhibitors: Secondary | ICD-10-CM | POA: Diagnosis not present

## 2022-09-30 DIAGNOSIS — M8589 Other specified disorders of bone density and structure, multiple sites: Secondary | ICD-10-CM | POA: Diagnosis not present

## 2022-11-30 DIAGNOSIS — I872 Venous insufficiency (chronic) (peripheral): Secondary | ICD-10-CM | POA: Diagnosis not present

## 2022-11-30 DIAGNOSIS — L97909 Non-pressure chronic ulcer of unspecified part of unspecified lower leg with unspecified severity: Secondary | ICD-10-CM | POA: Diagnosis not present

## 2022-12-12 DIAGNOSIS — I5189 Other ill-defined heart diseases: Secondary | ICD-10-CM | POA: Diagnosis not present

## 2022-12-12 DIAGNOSIS — N184 Chronic kidney disease, stage 4 (severe): Secondary | ICD-10-CM | POA: Diagnosis not present

## 2022-12-12 DIAGNOSIS — I129 Hypertensive chronic kidney disease with stage 1 through stage 4 chronic kidney disease, or unspecified chronic kidney disease: Secondary | ICD-10-CM | POA: Diagnosis not present

## 2022-12-12 DIAGNOSIS — E1122 Type 2 diabetes mellitus with diabetic chronic kidney disease: Secondary | ICD-10-CM | POA: Diagnosis not present

## 2022-12-12 DIAGNOSIS — N2581 Secondary hyperparathyroidism of renal origin: Secondary | ICD-10-CM | POA: Diagnosis not present

## 2022-12-16 ENCOUNTER — Encounter (HOSPITAL_BASED_OUTPATIENT_CLINIC_OR_DEPARTMENT_OTHER): Payer: PPO | Attending: General Surgery | Admitting: General Surgery

## 2022-12-16 DIAGNOSIS — E1122 Type 2 diabetes mellitus with diabetic chronic kidney disease: Secondary | ICD-10-CM | POA: Diagnosis not present

## 2022-12-16 DIAGNOSIS — I890 Lymphedema, not elsewhere classified: Secondary | ICD-10-CM | POA: Insufficient documentation

## 2022-12-16 DIAGNOSIS — L97812 Non-pressure chronic ulcer of other part of right lower leg with fat layer exposed: Secondary | ICD-10-CM | POA: Diagnosis not present

## 2022-12-16 DIAGNOSIS — N189 Chronic kidney disease, unspecified: Secondary | ICD-10-CM | POA: Diagnosis not present

## 2022-12-16 DIAGNOSIS — M069 Rheumatoid arthritis, unspecified: Secondary | ICD-10-CM | POA: Diagnosis not present

## 2022-12-16 DIAGNOSIS — E11622 Type 2 diabetes mellitus with other skin ulcer: Secondary | ICD-10-CM | POA: Insufficient documentation

## 2022-12-16 DIAGNOSIS — I129 Hypertensive chronic kidney disease with stage 1 through stage 4 chronic kidney disease, or unspecified chronic kidney disease: Secondary | ICD-10-CM | POA: Diagnosis not present

## 2022-12-16 DIAGNOSIS — I872 Venous insufficiency (chronic) (peripheral): Secondary | ICD-10-CM | POA: Insufficient documentation

## 2022-12-17 NOTE — Progress Notes (Signed)
Audrey [name], Audrey [name] ([id number]) [phone].pdf Page 1 of 4 Visit Report for 12/16/2022 Abuse Risk Screen Details Patient Name: Date of Service: Audrey [name], Audrey [name] NCE C. 12/16/2022 8:00 A M Medical Record Number: [id number] Patient Account Number: [id number] Date of Birth/Sex: Treating RN: 15-Mar-1951 (72 y.o. Gevena Mart Primary Care Marlyn Tondreau: [place], Betty Other Clinician: Referring Clem Wisenbaker: Treating Tucker Minter/Extender: Cannon, Jennifer [place], Betty Weeks in Treatment: 0 Abuse Risk Screen Items Answer ABUSE RISK SCREEN: Has anyone close to you tried to hurt or harm you recentlyo No Do you feel uncomfortable with anyone in your familyo No Has anyone forced you do things that you didnt want to doo No Electronic Signature(s) Signed: 12/16/2022 4:09:35 PM By: Brenton Grills Entered By: Brenton Grills on 12/16/2022 08:05:40 -------------------------------------------------------------------------------- Activities of Daily Living Details Patient Name: Date of Service: Audrey [name], Audrey [name] NCE C. 12/16/2022 8:00 A M Medical Record Number: [id number] Patient Account Number: [id number] Date of Birth/Sex: Treating RN: [birth year] (72 y.o. Gevena Mart Primary Care Lidiya Reise: [place], Betty Other Clinician: Referring Rayland Hamed: Treating Tyshawn Ciullo/Extender: Cannon, Jennifer [place], Betty Weeks in Treatment: 0 Activities of Daily Living Items Answer Activities of Daily Living (Please select one for each item) Drive Automobile Completely Able T Medications ake Completely Able Use T elephone Completely Able Care for Appearance Completely Able Use T oilet Completely Able Bath / Shower Completely Able Dress Self Completely Able Feed Self Completely Able Walk Completely Able Get In / Out Bed Completely Able Housework Completely Able Prepare Meals Completely Able Handle Money Completely Able Shop for Self Completely Able Electronic Signature(s) Signed: 12/16/2022  4:09:35 PM By: Brenton Grills Entered By: Brenton Grills on 12/16/2022 08:06:36 -------------------------------------------------------------------------------- Education Screening Details Patient Name: Date of Service: Audrey [name], [name] 12/16/2022 8:00 A M Medical Record Number: [id number] Patient Account Number: [id number] Date of Birth/Sex: Treating RN: 11/11/1950 (72 y.o. Gevena Mart Primary Care Varvara Legault: [place], Betty Other Clinician: Referring Shabree Tebbetts: Treating Wilber Fini/Extender: Cannon, Jennifer [place], Betty Weeks in Treatment: 0 ETHERINE, REDRICK ([id number]) [id number] Page 2 of 4 Learning Preferences/Education Level/Primary Language Highest Education Level: High School Preferred Language: Economist Language Barrier: No Translator Needed: No Memory Deficit: No Emotional Barrier: No Cultural/Religious Beliefs Affecting Medical Care: No Physical Barrier Impaired Vision: No Impaired Hearing: No Decreased Hand dexterity: No Knowledge/Comprehension Knowledge Level: High Comprehension Level: High Ability to understand written instructions: High Ability to understand verbal instructions: High Motivation Anxiety Level: Calm Cooperation: Cooperative Education Importance: Acknowledges Need Interest in Health Problems: Asks Questions Perception: Coherent Willingness to Engage in Self-Management High Activities: Readiness to Engage in Self-Management High Activities: Electronic Signature(s) Signed: 12/16/2022 4:09:35 PM By: Brenton Grills Entered By: Brenton Grills on 12/16/2022 08:07:26 -------------------------------------------------------------------------------- Fall Risk Assessment Details Patient Name: Date of Service: Audrey [name], [name] 12/16/2022 8:00 A M Medical Record Number: [id number] Patient Account Number: [id number] Date of Birth/Sex: Treating RN: Nov 26, 1950 (72 y.o. Gevena Mart Primary  Care Ilhan Madan: [place], Betty Other Clinician: Referring Hani Patnode: Treating Kameah Rawl/Extender: Cannon, Jennifer [place], Betty Weeks in Treatment: 0 Fall Risk Assessment Items Have you had 2 or more falls in the last 12 monthso 0 No Have you had any fall that resulted in injury in the last 12 monthso 0 No FALLS RISK SCREEN History of falling - immediate or within 3 months 0 No Secondary diagnosis (Do you have 2 or more medical diagnoseso) 0 No Ambulatory aid None/bed rest/wheelchair/nurse 0 No Crutches/cane/walker 0 No Furniture 0 No Intravenous therapy Access/Saline/Heparin Lock 0 No Gait/Transferring Normal/ bed  rest/ wheelchair 0 No Weak (short steps with or without shuffle, stooped but able to lift head while walking, may seek 0 No support from furniture) Impaired (short steps with shuffle, may have difficulty arising from chair, head down, impaired 0 No balance) Mental Status Oriented to own ability 0 No Electronic Signature(s) Hartshorn, Syana C ([id number]) [phone] [id number] Page 3 of 4 Signed: 12/16/2022 4:09:35 PM By: Brenton Grills Entered By: Brenton Grills on 12/16/2022 08:08:08 -------------------------------------------------------------------------------- Foot Assessment Details Patient Name: Date of Service: Audrey [name], [name] 12/16/2022 8:00 A M Medical Record Number: [id number] Patient Account Number: [id number] Date of Birth/Sex: Treating RN: [birth year] (72 y.o. Gevena Mart Primary Care Miracle Mongillo: [place], Betty Other Clinician: Referring Yarianna Varble: Treating Kailan Laws/Extender: Cannon, Jennifer [place], Betty Weeks in Treatment: 0 Foot Assessment Items Site Locations + = Sensation present, - = Sensation absent, C = Callus, U = Ulcer R = Redness, W = Warmth, M = Maceration, PU = Pre-ulcerative lesion F = Fissure, S = Swelling, D = Dryness Assessment Right: Left: Other Deformity: No No Prior Foot Ulcer: No No Prior Amputation:  No No Charcot Joint: No No Ambulatory Status: Ambulatory Without Help Gait: Steady Electronic Signature(s) Signed: 12/16/2022 4:09:35 PM By: Brenton Grills Entered By: Brenton Grills on 12/16/2022 08:10:02 -------------------------------------------------------------------------------- Nutrition Risk Screening Details Patient Name: Date of Service: Audrey [name], [name] 12/16/2022 8:00 A M Medical Record Number: [id number] Patient Account Number: [id number] Date of Birth/Sex: Treating RN: 09-22-1950 (72 y.o. Gevena Mart Primary Care Malikah Lakey: [place], Betty Other Clinician: Referring Susanne Baumgarner: Treating Amarie Tarte/Extender: Cannon, Jennifer [place], Betty Weeks in Treatment: 0 Height (in): 62 Weight (lbs): 214 Body Mass Index (BMI): 39.1 Blane, Shakiyah C ([id number]) [phone] [id number] Page 4 of 4 Nutrition Risk Screening Items Score Screening NUTRITION RISK SCREEN: I have an illness or condition that made me change the kind and/or amount of food I eat 0 No I eat fewer than two meals per day 0 No I eat few fruits and vegetables, or milk products 0 No I have three or more drinks of beer, liquor or wine almost every day 0 No I have tooth or mouth problems that make it hard for me to eat 0 No I don't always have enough money to buy the food I need 0 No I eat alone most of the time 0 No I take three or more different prescribed or over-the-counter drugs a day 0 No Without wanting to, I have lost or gained 10 pounds in the last six months 0 No I am not always physically able to shop, cook and/or feed myself 0 No Nutrition Protocols Good Risk Protocol Moderate Risk Protocol High Risk Proctocol Risk Level: Good Risk Score: 0 Electronic Signature(s) Signed: 12/16/2022 4:09:35 PM By: Brenton Grills Entered By: Brenton Grills on 12/16/2022 66:06:30

## 2022-12-27 DIAGNOSIS — I10 Essential (primary) hypertension: Secondary | ICD-10-CM | POA: Diagnosis not present

## 2022-12-27 DIAGNOSIS — E782 Mixed hyperlipidemia: Secondary | ICD-10-CM | POA: Diagnosis not present

## 2022-12-27 DIAGNOSIS — I5189 Other ill-defined heart diseases: Secondary | ICD-10-CM | POA: Diagnosis not present

## 2022-12-27 DIAGNOSIS — N184 Chronic kidney disease, stage 4 (severe): Secondary | ICD-10-CM | POA: Diagnosis not present

## 2022-12-27 DIAGNOSIS — E1165 Type 2 diabetes mellitus with hyperglycemia: Secondary | ICD-10-CM | POA: Diagnosis not present

## 2022-12-27 DIAGNOSIS — D0512 Intraductal carcinoma in situ of left breast: Secondary | ICD-10-CM | POA: Diagnosis not present

## 2022-12-27 DIAGNOSIS — Z0000 Encounter for general adult medical examination without abnormal findings: Secondary | ICD-10-CM | POA: Diagnosis not present

## 2022-12-27 DIAGNOSIS — E1142 Type 2 diabetes mellitus with diabetic polyneuropathy: Secondary | ICD-10-CM | POA: Diagnosis not present

## 2022-12-27 DIAGNOSIS — N2581 Secondary hyperparathyroidism of renal origin: Secondary | ICD-10-CM | POA: Diagnosis not present

## 2022-12-27 DIAGNOSIS — E1151 Type 2 diabetes mellitus with diabetic peripheral angiopathy without gangrene: Secondary | ICD-10-CM | POA: Diagnosis not present

## 2022-12-27 DIAGNOSIS — E1122 Type 2 diabetes mellitus with diabetic chronic kidney disease: Secondary | ICD-10-CM | POA: Diagnosis not present

## 2022-12-29 DIAGNOSIS — E1165 Type 2 diabetes mellitus with hyperglycemia: Secondary | ICD-10-CM | POA: Diagnosis not present

## 2022-12-29 DIAGNOSIS — E782 Mixed hyperlipidemia: Secondary | ICD-10-CM | POA: Diagnosis not present

## 2023-01-06 DIAGNOSIS — R600 Localized edema: Secondary | ICD-10-CM | POA: Diagnosis not present

## 2023-01-06 DIAGNOSIS — E1149 Type 2 diabetes mellitus with other diabetic neurological complication: Secondary | ICD-10-CM | POA: Diagnosis not present

## 2023-01-06 DIAGNOSIS — B351 Tinea unguium: Secondary | ICD-10-CM | POA: Diagnosis not present

## 2023-02-01 NOTE — Progress Notes (Signed)
ALGA, SOUTHALL ([id number]) [id number] Page 1 of 8 Visit Report for 12/16/2022 Chief Complaint Document Details Patient Name: Date of Service: [name] [name], [name] NCE [name]. 12/16/2022 8:00 A M Medical Record Number: [id number] Patient Account Number: [id number] Date of Birth/Sex: Treating RN: 01/10/1951 (72 y.o. F) Primary Care Provider: [place], Betty Other Clinician: Referring Provider: Treating Provider/Extender: Humzah Harty [place], Betty Weeks in Treatment: 0 Information Obtained from: Patient Chief Complaint Patient presents for treatment of an open ulcer due to venous insufficiency Electronic Signature(s) Signed: 12/16/2022 8:49:05 AM By: Duanne Guess MD FACS Previous Signature: 12/16/2022 8:17:29 AM Version By: Duanne Guess MD FACS Entered By: Duanne Guess on 12/16/2022 08:49:05 -------------------------------------------------------------------------------- Debridement Details Patient Name: Date of Service: [name] [name], [name] NSTA NCE [name]. 12/16/2022 8:00 A M Medical Record Number: [id number] Patient Account Number: [id number] Date of Birth/Sex: Treating RN: 11/29/1950 (72 y.o. [name] [name] Primary Care Provider: [place], Betty Other Clinician: Referring Provider: Treating Provider/Extender: Hudson Lehmkuhl [place], Betty Weeks in Treatment: 0 Debridement Performed for Assessment: Wound #1 Right,Posterior Lower Leg Performed By: Physician Duanne Guess, MD Debridement Type: Debridement Severity of Tissue Pre Debridement: Limited to breakdown of skin Level of Consciousness (Pre-procedure): Awake and Alert Pre-procedure Verification/Time Out Yes - 08:35 Taken: Start Time: 08:36 Pain Control: Lidocaine 4% Topical Solution Percent of Wound Bed Debrided: 100% T Area Debrided (cm): otal 3.92 Tissue and other material debrided: Non-Viable, Eschar Level: Non-Viable Tissue Debridement Description: Selective/Open Wound Instrument:  Curette Bleeding: None End Time: 08:38 Procedural Pain: 0 Post Procedural Pain: 0 Response to Treatment: Procedure was tolerated well Level of Consciousness (Post- Awake and Alert procedure): Post Debridement Measurements of Total Wound Length: (cm) 2 Width: (cm) 2.5 Depth: (cm) 0.1 Volume: (cm) 0.393 Character of Wound/Ulcer Post Debridement: Improved Hartland, [name] [name] ([id number]) [id number] Page 2 of 8 Severity of Tissue Post Debridement: Limited to breakdown of skin Post Procedure Diagnosis Same as Pre-procedure Notes Scribed for Dr [name] [name] by Brenton Grills RN Electronic Signature(s) Signed: 12/16/2022 8:59:44 AM By: Duanne Guess MD FACS Signed: 12/16/2022 4:09:35 PM By: Brenton Grills Entered By: Brenton Grills on 12/16/2022 08:38:47 -------------------------------------------------------------------------------- HPI Details Patient Name: Date of Service: [name] [name], [name] NSTA NCE [name]. 12/16/2022 8:00 A M Medical Record Number: [id number] Patient Account Number: [id number] Date of Birth/Sex: Treating RN: Apr 09, 1951 (72 y.o. F) Primary Care Provider: [place], Betty Other Clinician: Referring Provider: Treating Provider/Extender: [name] [name] [place], Betty Weeks in Treatment: 0 History of Present Illness HPI Description: ADMISSION 12/16/2022 This is a 72 year old type II diabetic (no recent hemoglobin A1c available for review) also has a history of breast cancer and endometrial carcinoma. She has venous stasis in the bilateral lower extremities and has developed an ulcer on her right leg. Her primary care provider referred her for further evaluation and management. She has been applying topical mupirocin and triamcinolone. She does not wear compression stockings. ABIs were 0.90 on the right and 0.97 on the left. Electronic Signature(s) Signed: 12/16/2022 8:49:15 AM By: Duanne Guess MD FACS Previous Signature: 12/16/2022 8:19:53 AM Version By: Duanne Guess MD FACS Entered By: Duanne Guess on 12/16/2022 08:49:15 -------------------------------------------------------------------------------- Physical Exam Details Patient Name: Date of Service: [name] [name], [name] NSTA NCE [name]. 12/16/2022 8:00 A M Medical Record Number: [id number] Patient Account Number: [id number] Date of Birth/Sex: Treating RN: 21-Jul-1950 (72 y.o. F) Primary Care Provider: [place], Betty Other Clinician: Referring Provider: Treating Provider/Extender: Makennah Omura [place], Betty Weeks in Treatment: 0 Constitutional Hypertensive, asymptomatic. . . . No acute distress. Respiratory Normal work of breathing on room air. Cardiovascular  1+ pitting edema bilaterally. Notes 12/16/2022: On her posterior right leg, just above the Achilles area, she has a circular ulcer covered with thin dry eschar. Upon debridement of the eschar, the wound was found to be healed. LOREAL, SCHUESSLER ([id number]) [id number] Page 3 of 8 Electronic Signature(s) Signed: 12/16/2022 8:52:02 AM By: Duanne Guess MD FACS Entered By: Duanne Guess on 12/16/2022 08:52:01 -------------------------------------------------------------------------------- Physician Orders Details Patient Name: Date of Service: [name] [name] NSTA NCE [name]. 12/16/2022 8:00 A M Medical Record Number: [id number] Patient Account Number: [id number] Date of Birth/Sex: Treating RN: 1951-06-23 (72 y.o. [name] [name] Primary Care Provider: [place], Betty Other Clinician: Referring Provider: Treating Provider/Extender: [place] Mcphee [place], Betty Weeks in Treatment: 0 Verbal / Phone Orders: No Diagnosis Coding ICD-10 Coding Code Description [phone] Non-pressure chronic ulcer of other part of right lower leg with fat layer exposed E11.622 Type 2 diabetes mellitus with other skin ulcer I87.2 Venous insufficiency (chronic) (peripheral) Discharge From [facility] Services Discharge from Wound Care Center -  Please call us if you need [place]. Congratulations on healing! Continue to use TCA as needed. Bathing/ Shower/ Hygiene May shower and wash wound with soap and water. Edema Control - Lymphedema / SCD / Other Bilateral Lower Extremities Patient to wear own compression stockings every day. Compression stocking or Garment 30-40 mm/Hg pressure to: - Apply to both legs daily. May remove at night. Electronic Signature(s) Signed: 12/16/2022 8:52:12 AM By: Duanne Guess MD FACS Entered By: Duanne Guess on 12/16/2022 08:52:12 -------------------------------------------------------------------------------- Problem List Details Patient Name: Date of Service: [name] [name], [name] NSTA NCE [name]. 12/16/2022 8:00 A M Medical Record Number: [id number] Patient Account Number: [id number] Date of Birth/Sex: Treating RN: 1951/03/14 (72 y.o. F) Primary Care Provider: [place], Betty Other Clinician: Referring Provider: Treating Provider/Extender: Craige Patel [place], Betty Weeks in Treatment: 0 Active Problems ICD-10 Encounter Code Description Active Date MDM Diagnosis L97.812 Non-pressure chronic ulcer of other part of right lower leg with fat layer 12/16/2022 No Yes exposed Parthasarathy, Jyoti [name] ([id number]) [phone].pdf Page 4 of 8 E11.622 Type 2 diabetes mellitus with other skin ulcer 12/16/2022 No Yes I87.2 Venous insufficiency (chronic) (peripheral) 12/16/2022 No Yes Inactive Problems Resolved Problems Electronic Signature(s) Signed: 12/16/2022 8:17:18 AM By: Duanne Guess MD FACS Entered By: Duanne Guess on 12/16/2022 08:17:18 -------------------------------------------------------------------------------- Progress Note Details Patient Name: Date of Service: [name] [name], [name] NSTA NCE [name]. 12/16/2022 8:00 A M Medical Record Number: [id number] Patient Account Number: [id number] Date of Birth/Sex: Treating RN: 01-03-1951 (72 y.o. F) Primary Care Provider: [place], Betty Other  Clinician: Referring Provider: Treating Provider/Extender: Drayke Grabel [place], Betty Weeks in Treatment: 0 Subjective Chief Complaint Information obtained from Patient Patient presents for treatment of an open ulcer due to venous insufficiency History of Present Illness (HPI) ADMISSION 12/16/2022 This is a 72 year old type II diabetic (no recent hemoglobin A1c available for review) also has a history of breast cancer and endometrial carcinoma. She has venous stasis in the bilateral lower extremities and has developed an ulcer on her right leg. Her primary care provider referred her for further evaluation and management. She has been applying topical mupirocin and triamcinolone. She does not wear compression stockings. ABIs were 0.90 on the right and 0.97 on the left. Patient History Information obtained from Chart. Allergies No Known Allergies Medical History Cardiovascular Patient has history of Hypertension Endocrine Patient has history of Type II Diabetes Musculoskeletal Patient has history of Rheumatoid Arthritis Neurologic Patient has history of Neuropathy Hospitalization/Surgery History - abdominal hysterectomy. - carpal tunnel release. Medical A Surgical History Notes  nd Endocrine SIADH Genitourinary Chronic Renal Failure, CKD Musculoskeletal Degenerative Disc Disease Oncologic Endometrial Cancer, breast cancer Review of Systems (ROS) Cardiovascular Venous Stasis Integumentary (Skin) Dingledine, Deatra [name] ([id number]) [id number] Page 5 of 8 Complains or has symptoms of Wounds - posterior right lower extremity. Psychiatric Denies complaints or symptoms of Claustrophobia. Objective Constitutional Hypertensive, asymptomatic. No acute distress. Vitals Time Taken: 8:02 AM, Height: 62 in, Source: Stated, Weight: 214 lbs, BMI: 39.1, Temperature: 98.4 F, Pulse: 74 bpm, Respiratory Rate: 18 breaths/min, Blood Pressure: 156/82 mmHg,  Capillary Blood Glucose: 120 mg/dl. Respiratory Normal work of breathing on room air. Cardiovascular 1+ pitting edema bilaterally. General Notes: 12/16/2022: On her posterior right leg, just above the Achilles area, she has a circular ulcer covered with thin dry eschar. Upon debridement of the eschar, the wound was found to be healed. Integumentary (Hair, Skin) Wound #1 status is Healed - Epithelialized. Original cause of wound was Blister. The date acquired was: 11/09/2022. The wound is located on the Right,Posterior Lower Leg. The wound measures 0cm length x 0cm width x 0cm depth; 0cm^2 area and 0cm^3 volume. There is no tunneling or undermining noted. There is a medium amount of serosanguineous drainage noted. The wound margin is distinct with the outline attached to the wound base. There is no granulation within the wound bed. There is no necrotic tissue within the wound bed. The periwound skin appearance had no abnormalities noted for texture. The periwound skin appearance had no abnormalities noted for color. The periwound skin appearance did not exhibit: Dry/Scaly, Maceration. Assessment Active Problems ICD-10 Non-pressure chronic ulcer of other part of right lower leg with fat layer exposed Type 2 diabetes mellitus with other skin ulcer Venous insufficiency (chronic) (peripheral) Procedures Wound #1 Pre-procedure diagnosis of Wound #1 is a Venous Leg Ulcer located on the Right,Posterior Lower Leg .Severity of Tissue Pre Debridement is: Limited to breakdown of skin. There was a Selective/Open Wound Non-Viable Tissue Debridement with a total area of 3.92 sq cm performed by Duanne Guess, MD. With the following instrument(s): Curette to remove Non-Viable tissue/material. Material removed includes Eschar after achieving pain control using Lidocaine 4% T opical Solution. No specimens were taken. A time out was conducted at 08:35, prior to the start of the procedure. There was no bleeding.  The procedure was tolerated well with a pain level of 0 throughout and a pain level of 0 following the procedure. Post Debridement Measurements: 2cm length x 2.5cm width x 0.1cm depth; 0.393cm^3 volume. Character of Wound/Ulcer Post Debridement is improved. Severity of Tissue Post Debridement is: Limited to breakdown of skin. Post procedure Diagnosis Wound #1: Same as Pre-Procedure General Notes: Scribed for Dr [name] [name] by Brenton Grills RN. Plan Discharge From [facility] Services: Discharge from Wound Care Center - Please call us if you need [place]. Congratulations on healing! Continue to use TCA as needed. Bathing/ Shower/ Hygiene: May shower and wash wound with soap and water. Edema Control - Lymphedema / SCD / Other: Patient to wear own compression stockings every day. Compression stocking or Garment 30-40 mm/Hg pressure to: - Apply to both legs daily. May remove at night. 12/16/2022: This is a 72 year old woman referred to [place] for a venous stasis ulcer. On her posterior right leg, just above the Achilles area, she has a circular ulcer covered with thin dry eschar. Upon debridement of the eschar, the wound was found to be healed. JAMYAH, FOLK ([id number]) [id number] Page 6 of 8 I used a curette to debride the eschar from the wound,  which revealed complete healing and epithelialization. She does not wear compression stockings and I think she would benefit from these. I think either 15-20 or 20 to 30 mmHg compression would be adequate for her. She should also elevate her legs is much as possible throughout the day and at night while she is sleeping. She should use a good moisturizer to keep her skin supple. She may follow-up as needed. Electronic Signature(s) Signed: 02/01/2023 10:23:04 AM By: Pearletha Alfred Signed: 02/01/2023 11:31:44 AM By: Duanne Guess MD FACS Previous Signature: 12/16/2022 8:54:10 AM Version By: Duanne Guess MD FACS Entered By: Pearletha Alfred on  02/01/2023 10:23:04 -------------------------------------------------------------------------------- HxROS Details Patient Name: Date of Service: [name] [name], [name] NSTA NCE [name]. 12/16/2022 8:00 A M Medical Record Number: [id number] Patient Account Number: [id number] Date of Birth/Sex: Treating RN: [birth year] (72 y.o. [name] [name] Primary Care Provider: [place], Betty Other Clinician: Referring Provider: Treating Provider/Extender: Isac Lincks [place], Betty Weeks in Treatment: 0 Information Obtained From Chart Integumentary (Skin) Complaints and Symptoms: Positive for: Wounds - posterior right lower extremity Psychiatric Complaints and Symptoms: Negative for: Claustrophobia Cardiovascular Complaints and Symptoms: Review of System Notes: Venous Stasis Medical History: Positive for: Hypertension Endocrine Medical History: Positive for: Type II Diabetes Past Medical History Notes: SIADH Genitourinary Medical History: Past Medical History Notes: Chronic Renal Failure, CKD Musculoskeletal Medical History: Positive for: Rheumatoid Arthritis Past Medical History Notes: Degenerative Disc Disease Neurologic Medical History: Positive for: Neuropathy Oncologic [name] [name], [name] [name] ([id number]) [phone].pdf Page 7 of 8 Medical History: Past Medical History Notes: Endometrial Cancer, breast cancer Immunizations Pneumococcal Vaccine: Received Pneumococcal Vaccination: No Implantable Devices No devices added Hospitalization / Surgery History Type of Hospitalization/Surgery abdominal hysterectomy carpal tunnel release Electronic Signature(s) Signed: 12/16/2022 8:59:44 AM By: Duanne Guess MD FACS Signed: 12/16/2022 4:09:35 PM By: Brenton Grills Entered By: Brenton Grills on 12/16/2022 07:54:00 -------------------------------------------------------------------------------- SuperBill Details Patient Name: Date of Service: [name] [name], [name] NSTA NCE [name].  12/16/2022 Medical Record Number: [id number] Patient Account Number: [id number] Date of Birth/Sex: Treating RN: 1951/03/03 (72 y.o. [name] [name] Primary Care Provider: [place], Betty Other Clinician: Referring Provider: Treating Provider/Extender: Jafeth Mustin [place], Betty Weeks in Treatment: 0 Diagnosis Coding ICD-10 Codes Code Description [id number] Non-pressure chronic ulcer of other part of right lower leg with fat layer exposed E11.622 Type 2 diabetes mellitus with other skin ulcer I87.2 Venous insufficiency (chronic) (peripheral) Facility Procedures : CPT4 Code: [id number] Description: 97597 - DEBRIDE WOUND 1ST 20 SQ CM OR < ICD-10 Diagnosis Description L97.812 Non-pressure chronic ulcer of other part of right lower leg with fat layer expos Modifier: ed Quantity: 1 Physician Procedures : CPT4 Code Description Modifier [id number] - WC PHYS LEVEL 4 - NEW PT 25 ICD-10 Diagnosis Description L97.812 Non-pressure chronic ulcer of other part of right lower leg with fat layer exposed E11.622 Type 2 diabetes mellitus with other skin ulcer  I87.2 Venous insufficiency (chronic) (peripheral) Quantity: 1 : [id number] 97597 - WC PHYS DEBR WO ANESTH 20 SQ CM ICD-10 Diagnosis Description L97.812 Non-pressure chronic ulcer of other part of right lower leg with fat layer exposed Quantity: 1 Electronic Signature(s) Signed: 12/16/2022 8:54:24 AM By: Duanne Guess MD FACS Scialdone, Maryclare Bean ([id number]) [phone].pdf Page 8 of 8 Entered By: Duanne Guess on 12/16/2022 08:54:24

## 2023-02-22 DIAGNOSIS — M7989 Other specified soft tissue disorders: Secondary | ICD-10-CM | POA: Diagnosis not present

## 2023-02-22 DIAGNOSIS — M85871 Other specified disorders of bone density and structure, right ankle and foot: Secondary | ICD-10-CM | POA: Diagnosis not present

## 2023-02-22 DIAGNOSIS — M19071 Primary osteoarthritis, right ankle and foot: Secondary | ICD-10-CM | POA: Diagnosis not present

## 2023-02-22 DIAGNOSIS — G8929 Other chronic pain: Secondary | ICD-10-CM | POA: Diagnosis not present

## 2023-02-22 DIAGNOSIS — M25571 Pain in right ankle and joints of right foot: Secondary | ICD-10-CM | POA: Diagnosis not present

## 2023-03-06 DIAGNOSIS — M25571 Pain in right ankle and joints of right foot: Secondary | ICD-10-CM | POA: Diagnosis not present

## 2023-03-08 DIAGNOSIS — M25571 Pain in right ankle and joints of right foot: Secondary | ICD-10-CM | POA: Diagnosis not present

## 2023-03-09 DIAGNOSIS — I872 Venous insufficiency (chronic) (peripheral): Secondary | ICD-10-CM | POA: Diagnosis not present

## 2023-03-14 DIAGNOSIS — M25571 Pain in right ankle and joints of right foot: Secondary | ICD-10-CM | POA: Diagnosis not present

## 2023-03-16 DIAGNOSIS — M25571 Pain in right ankle and joints of right foot: Secondary | ICD-10-CM | POA: Diagnosis not present

## 2023-03-20 DIAGNOSIS — M25571 Pain in right ankle and joints of right foot: Secondary | ICD-10-CM | POA: Diagnosis not present

## 2023-03-23 DIAGNOSIS — M25571 Pain in right ankle and joints of right foot: Secondary | ICD-10-CM | POA: Diagnosis not present

## 2023-03-27 DIAGNOSIS — M25571 Pain in right ankle and joints of right foot: Secondary | ICD-10-CM | POA: Diagnosis not present

## 2023-03-29 DIAGNOSIS — Z23 Encounter for immunization: Secondary | ICD-10-CM | POA: Diagnosis not present

## 2023-03-29 DIAGNOSIS — N184 Chronic kidney disease, stage 4 (severe): Secondary | ICD-10-CM | POA: Diagnosis not present

## 2023-03-29 DIAGNOSIS — E1122 Type 2 diabetes mellitus with diabetic chronic kidney disease: Secondary | ICD-10-CM | POA: Diagnosis not present

## 2023-03-29 DIAGNOSIS — I10 Essential (primary) hypertension: Secondary | ICD-10-CM | POA: Diagnosis not present

## 2023-03-30 DIAGNOSIS — M25571 Pain in right ankle and joints of right foot: Secondary | ICD-10-CM | POA: Diagnosis not present

## 2023-04-04 DIAGNOSIS — M25571 Pain in right ankle and joints of right foot: Secondary | ICD-10-CM | POA: Diagnosis not present

## 2023-04-07 DIAGNOSIS — M25571 Pain in right ankle and joints of right foot: Secondary | ICD-10-CM | POA: Diagnosis not present

## 2023-04-10 DIAGNOSIS — M25571 Pain in right ankle and joints of right foot: Secondary | ICD-10-CM | POA: Diagnosis not present

## 2023-04-11 DIAGNOSIS — I872 Venous insufficiency (chronic) (peripheral): Secondary | ICD-10-CM | POA: Diagnosis not present

## 2023-04-12 DIAGNOSIS — M25571 Pain in right ankle and joints of right foot: Secondary | ICD-10-CM | POA: Diagnosis not present

## 2023-04-18 DIAGNOSIS — M25571 Pain in right ankle and joints of right foot: Secondary | ICD-10-CM | POA: Diagnosis not present

## 2023-04-21 DIAGNOSIS — M25571 Pain in right ankle and joints of right foot: Secondary | ICD-10-CM | POA: Diagnosis not present

## 2023-04-25 DIAGNOSIS — M25571 Pain in right ankle and joints of right foot: Secondary | ICD-10-CM | POA: Diagnosis not present

## 2023-04-27 DIAGNOSIS — M25571 Pain in right ankle and joints of right foot: Secondary | ICD-10-CM | POA: Diagnosis not present

## 2023-05-01 DIAGNOSIS — M25571 Pain in right ankle and joints of right foot: Secondary | ICD-10-CM | POA: Diagnosis not present

## 2023-05-03 DIAGNOSIS — M25571 Pain in right ankle and joints of right foot: Secondary | ICD-10-CM | POA: Diagnosis not present

## 2023-05-12 DIAGNOSIS — I10 Essential (primary) hypertension: Secondary | ICD-10-CM | POA: Diagnosis not present

## 2023-05-12 DIAGNOSIS — Z6837 Body mass index (BMI) 37.0-37.9, adult: Secondary | ICD-10-CM | POA: Diagnosis not present

## 2023-05-12 DIAGNOSIS — L918 Other hypertrophic disorders of the skin: Secondary | ICD-10-CM | POA: Diagnosis not present

## 2023-05-23 ENCOUNTER — Encounter (HOSPITAL_BASED_OUTPATIENT_CLINIC_OR_DEPARTMENT_OTHER): Payer: PPO | Attending: Internal Medicine | Admitting: Internal Medicine

## 2023-05-23 DIAGNOSIS — N189 Chronic kidney disease, unspecified: Secondary | ICD-10-CM | POA: Insufficient documentation

## 2023-05-23 DIAGNOSIS — I890 Lymphedema, not elsewhere classified: Secondary | ICD-10-CM | POA: Insufficient documentation

## 2023-05-23 DIAGNOSIS — I129 Hypertensive chronic kidney disease with stage 1 through stage 4 chronic kidney disease, or unspecified chronic kidney disease: Secondary | ICD-10-CM | POA: Diagnosis not present

## 2023-05-23 DIAGNOSIS — E1122 Type 2 diabetes mellitus with diabetic chronic kidney disease: Secondary | ICD-10-CM | POA: Diagnosis not present

## 2023-05-23 DIAGNOSIS — Z87891 Personal history of nicotine dependence: Secondary | ICD-10-CM | POA: Diagnosis not present

## 2023-05-23 DIAGNOSIS — E11622 Type 2 diabetes mellitus with other skin ulcer: Secondary | ICD-10-CM | POA: Insufficient documentation

## 2023-05-23 DIAGNOSIS — I872 Venous insufficiency (chronic) (peripheral): Secondary | ICD-10-CM | POA: Diagnosis not present

## 2023-05-23 DIAGNOSIS — L97228 Non-pressure chronic ulcer of left calf with other specified severity: Secondary | ICD-10-CM | POA: Diagnosis not present

## 2023-05-23 DIAGNOSIS — M069 Rheumatoid arthritis, unspecified: Secondary | ICD-10-CM | POA: Insufficient documentation

## 2023-05-23 DIAGNOSIS — E1140 Type 2 diabetes mellitus with diabetic neuropathy, unspecified: Secondary | ICD-10-CM | POA: Diagnosis not present

## 2023-05-24 NOTE — Progress Notes (Signed)
MIRAY, ABERNATHY ([id number]) [phone].pdf Page 1 of 4 Visit Report for 05/23/2023 Abuse Risk Screen Details Patient Name: Date of Service: Audrey [name], Audrey NCE [name]. 05/23/2023 9:30 A M Medical Record Number: [id number] Patient Account Number: [id number] Date of Birth/Sex: Treating RN: Sep 05, 1950 (72 y.o. Audrey [name], Yvonne Kendall Primary Care Belinda Bringhurst: [place], Betty Other Clinician: Referring Nature Kueker: Treating Liz Pinho/Extender: Robson, Michael [place], Betty Weeks in Treatment: 0 Abuse Risk Screen Items Answer ABUSE RISK SCREEN: Has anyone close to you tried to hurt or harm you recentlyo No Do you feel uncomfortable with anyone in your familyo No Has anyone forced you do things that you didnt want to doo No Electronic Signature(s) Signed: 05/23/2023 5:16:35 PM By: Shawn Stall RN, BSN Entered By: Shawn Stall on 05/23/2023 06:53:43 -------------------------------------------------------------------------------- Activities of Daily Living Details Patient Name: Date of Service: Audrey [name], Audrey NSTA NCE [name]. 05/23/2023 9:30 A M Medical Record Number: [id number] Patient Account Number: [id number] Date of Birth/Sex: Treating RN: Apr 30, 1951 (72 y.o. Audrey [name] Primary Care Volney Reierson: [place], Betty Other Clinician: Referring Alfonzo Arca: Treating Olisa Quesnel/Extender: Robson, Michael [place], Betty Weeks in Treatment: 0 Activities of Daily Living Items Answer Activities of Daily Living (Please select one for each item) Drive Automobile Completely Able T Medications ake Completely Able Use T elephone Completely Able Care for Appearance Completely Able Use T oilet Completely Able Bath / Shower Completely Able Dress Self Completely Able Feed Self Completely Able Walk Completely Able Get In / Out Bed Completely Able Housework Completely Able Prepare Meals Completely Able Handle Money Completely Able Shop for Self Completely Able Electronic Signature(s) Signed:  05/23/2023 5:16:35 PM By: Shawn Stall RN, BSN Entered By: Shawn Stall on 05/23/2023 06:54:06 Anaya, Maryclare Bean ([id number]) [id number] Page 2 of 4 -------------------------------------------------------------------------------- Education Screening Details Patient Name: Date of Service: Audrey [name], Audrey NCE [name]. 05/23/2023 9:30 A M Medical Record Number: [id number] Patient Account Number: [id number] Date of Birth/Sex: Treating RN: 08-Jun-1951 (72 y.o. Audrey [name], Yvonne Kendall Primary Care Sacoya Mcgourty: [place], Betty Other Clinician: Referring Chaela Branscum: Treating Berthel Bagnall/Extender: Robson, Michael [place], Betty Weeks in Treatment: 0 Primary Learner Assessed: Patient Learning Preferences/Education Level/Primary Language Learning Preference: Explanation, Demonstration, Printed Material Highest Education Level: High School Preferred Language: English Cognitive Barrier Language Barrier: No Translator Needed: No Memory Deficit: No Emotional Barrier: No Cultural/Religious Beliefs Affecting Medical Care: No Physical Barrier Impaired Vision: No Impaired Hearing: No Decreased Hand dexterity: No Knowledge/Comprehension Knowledge Level: High Comprehension Level: High Ability to understand written instructions: High Ability to understand verbal instructions: High Motivation Anxiety Level: Calm Cooperation: Cooperative Education Importance: Acknowledges Need Interest in Health Problems: Asks Questions Perception: Coherent Willingness to Engage in Self-Management High Activities: Readiness to Engage in Self-Management High Activities: Electronic Signature(s) Signed: 05/23/2023 5:16:35 PM By: Shawn Stall RN, BSN Entered By: Shawn Stall on 05/23/2023 06:54:25 -------------------------------------------------------------------------------- Fall Risk Assessment Details Patient Name: Date of Service: Audrey [name], Audrey NSTA NCE [name]. 05/23/2023 9:30 A M Medical Record  Number: [id number] Patient Account Number: [id number] Date of Birth/Sex: Treating RN: [birth year] (72 y.o. Audrey [name] Primary Care Yoel Kaufhold: [place], Betty Other Clinician: Referring Generoso Cropper: Treating Inaya Gillham/Extender: Robson, Michael [place], Betty Weeks in Treatment: 0 Fall Risk Assessment Items Have you had 2 or more falls in the last 12 monthso 0 No Audrey [name], Audrey [name] ([id number]) [phone] [id number] Page 3 of 4 Have you had any fall that resulted in injury in the last 12 monthso 0 No FALLS RISK SCREEN History of falling - immediate or within 3 months 0 No Secondary diagnosis (Do you have 2 or  more medical diagnoseso) 0 No Ambulatory aid None/bed rest/wheelchair/nurse 0 Yes Crutches/cane/walker 0 No Furniture 0 No Intravenous therapy Access/Saline/Heparin Lock 0 No Gait/Transferring Normal/ bed rest/ wheelchair 0 Yes Weak (short steps with or without shuffle, stooped but able to lift head while walking, may seek 0 No support from furniture) Impaired (short steps with shuffle, may have difficulty arising from chair, head down, impaired 0 No balance) Mental Status Oriented to own ability 0 Yes Electronic Signature(s) Signed: 05/23/2023 5:16:35 PM By: Shawn Stall RN, BSN Entered By: Shawn Stall on 05/23/2023 06:55:14 -------------------------------------------------------------------------------- Foot Assessment Details Patient Name: Date of Service: Audrey [name], Audrey NSTA NCE [name]. 05/23/2023 9:30 A M Medical Record Number: [id number] Patient Account Number: [id number] Date of Birth/Sex: Treating RN: 02/08/1951 (71 y.o. Audrey [name] Primary Care Luie Laneve: [place], Betty Other Clinician: Referring Sruthi Maurer: Treating Heavyn Yearsley/Extender: Robson, Michael [place], Betty Weeks in Treatment: 0 Foot Assessment Items Site Locations + = Sensation present, - = Sensation absent, [name] = Callus, U = Ulcer R = Redness, W = Warmth, M = Maceration, PU =  Pre-ulcerative lesion F = Fissure, S = Swelling, D = Dryness Assessment Right: Left: Other Deformity: No No Prior Foot Ulcer: No No Prior Amputation: No No Charcot Joint: No No Ambulatory Status: Ambulatory Without Help Gait: Steady Audrey [name], Audrey [name] ([id number]) [id number] Page 4 of 4 Notes NO wounds. Electronic Signature(s) Signed: 05/23/2023 5:16:35 PM By: Shawn Stall RN, BSN Entered By: Shawn Stall on 05/23/2023 06:55:26 -------------------------------------------------------------------------------- Nutrition Risk Screening Details Patient Name: Date of Service: Audrey [name], Audrey NSTA NCE [name]. 05/23/2023 9:30 A M Medical Record Number: [id number] Patient Account Number: [id number] Date of Birth/Sex: Treating RN: Oct 05, 1950 (72 y.o. Audrey [name], Yvonne Kendall Primary Care Denesha Brouse: [place], Betty Other Clinician: Referring Yanitza Shvartsman: Treating Wajiha Versteeg/Extender: Robson, Michael [place], Betty Weeks in Treatment: 0 Height (in): 62 Weight (lbs): 209 Body Mass Index (BMI): 38.2 Nutrition Risk Screening Items Score Screening NUTRITION RISK SCREEN: I have an illness or condition that made me change the kind and/or amount of food I eat 2 Yes I eat fewer than two meals per day 0 No I eat few fruits and vegetables, or milk products 0 No I have three or more drinks of beer, liquor or wine almost every day 0 No I have tooth or mouth problems that make it hard for me to eat 0 No I don't always have enough money to buy the food I need 0 No I eat alone most of the time 0 No I take three or more different prescribed or over-the-counter drugs a day 1 Yes Without wanting to, I have lost or gained 10 pounds in the last six months 0 No I am not always physically able to shop, cook and/or feed myself 0 No Nutrition Protocols Good Risk Protocol Provide education on elevated blood Moderate Risk Protocol 0 sugars and impact on wound healing, as applicable High Risk  Proctocol Risk Level: Moderate Risk Score: 3 Electronic Signature(s) Signed: 05/23/2023 5:16:35 PM By: Shawn Stall RN, BSN Entered By: Shawn Stall on 05/23/2023 06:55:04

## 2023-05-24 NOTE — Progress Notes (Signed)
ASHANTA, PENNY ([id number]) [phone].pdf Page 1 of 7 Visit Report for 05/23/2023 Chief Complaint Document Details Patient Name: Date of Service: CHARITO, ZULLO NCE C. 05/23/2023 9:30 A M Medical Record Number: [id number] Patient Account Number: [id number] Date of Birth/Sex: Treating RN: Jan 27, 1951 (72 y.o. F) Primary Care Provider: [place], Betty Other Clinician: Referring Provider: Treating Provider/Extender: Edwyna Dangerfield [place], Betty Weeks in Treatment: 0 Information Obtained from: Patient Chief Complaint Patient presents for treatment of an open ulcer due to venous insufficiency 05/23/2023; patient returns to clinic with a wound on the left posterior calf Electronic Signature(s) Signed: 05/24/2023 10:44:47 AM By: Baltazar Najjar MD Entered By: Baltazar Najjar on 05/23/2023 07:06:59 -------------------------------------------------------------------------------- HPI Details Patient Name: Date of Service: [place], [place] NSTA NCE C. 05/23/2023 9:30 A M Medical Record Number: [id number] Patient Account Number: [id number] Date of Birth/Sex: Treating RN: [birth year] (72 y.o. F) Primary Care Provider: [place], Betty Other Clinician: Referring Provider: Treating Provider/Extender: Jaivian Battaglini [place], Betty Weeks in Treatment: 0 History of Present Illness HPI Description: ADMISSION 12/16/2022 This is a 72 year old type II diabetic (no recent hemoglobin A1c available for review) also has a history of breast cancer and endometrial carcinoma. She has venous stasis in the bilateral lower extremities and has developed an ulcer on her right leg. Her primary care provider referred her for further evaluation and management. She has been applying topical mupirocin and triamcinolone. She does not wear compression stockings. ABIs were 0.90 on the right and 0.97 on the left. READMISSION 05/23/2023 This is a now 72 year old woman who was here earlier this year in June  and saw Dr. Lady Gary with a wound on her right leg. This had healed by the time she came in the clinic. It was suggested she wear stockings. She had a pair of support stockings from [employer] that she wears intermittently but certainly did not take this suggestion the heart. She developed a recent blister on the left posterior calf. This has healed by the time she came here today. In discussion with the patient she has had edema in her legs for perhaps as long as 8 to 10 years. Past medical history includes type 2 diabetes, chronic venous insufficiency, chronic renal failure, right breast CA, hypertension, hyperlipidemia, type 2 diabetes with angiopathy. We did not repeat her ABIs in clinic today these were normal earlier this year listed above in Dr. America Brown note Electronic Signature(s) Signed: 05/24/2023 10:44:47 AM By: Baltazar Najjar MD Entered By: Baltazar Najjar on 05/23/2023 07:09:10 Nierman, Maryclare Bean ([id number]) [phone].pdf Page 2 of 7 -------------------------------------------------------------------------------- Physical Exam Details Patient Name: Date of Service: ILIA, SPROULL NCE C. 05/23/2023 9:30 A M Medical Record Number: [id number] Patient Account Number: [id number] Date of Birth/Sex: Treating RN: 1951/01/04 (72 y.o. F) Primary Care Provider: [place], Betty Other Clinician: Referring Provider: Treating Provider/Extender: Astrid Vides [place], Betty Weeks in Treatment: 0 Constitutional Sitting or standing Blood Pressure is within target range for patient.. Pulse regular and within target range for patient.Marland Kitchen Respirations regular, non-labored and within target range.. Temperature is normal and within the target range for the patient.Marland Kitchen Appears in no distress. Respiratory work of breathing is normal. Bilateral breath sounds are clear and equal in all lobes with no wheezes, rales or rhonchi.. Cardiovascular Soft systolic murmur no evidence of heart  failure. Notes Wound exam; the area on the left posterior calf is healed. The patient has significant bilateral lower extremity edema that is nonpitting. Hemosiderin deposition bilaterally. Skin is becoming adherent to underlying soft tissue. There is no edema above her  knees. Electronic Signature(s) Signed: 05/24/2023 10:44:47 AM By: Baltazar Najjar MD Entered By: Baltazar Najjar on 05/23/2023 07:11:53 -------------------------------------------------------------------------------- Physician Orders Details Patient Name: Date of Service: [place], [place] NSTA NCE C. 05/23/2023 9:30 A M Medical Record Number: [id number] Patient Account Number: [id number] Date of Birth/Sex: Treating RN: [birth year] (72 y.o. Arta Silence Primary Care Provider: [place], Betty Other Clinician: Referring Provider: Treating Provider/Extender: Ofelia Podolski [place], Betty Weeks in Treatment: 0 The following information was scribed by: Shawn Stall The information was scribed for: Baltazar Najjar Verbal / Phone Orders: No Diagnosis Coding ICD-10 Coding Code Description I89.0 Lymphedema, not elsewhere classified Discharge From [facility] Discharge from Wound Care Center - Call if any future wound care needs. Wear compression stockings for life. Leg measurements provided. may purchase from elastic therapy or any medical supply store. Think about sock butler to assist with compression stockings. Compression stockings or compression garments- juxtalite HD from [employer]. Edema Control - Orders / Instructions Elevate legs to the level of the heart or above for 30 minutes daily and/or when sitting for 3-4 times a day throughout the day. Avoid standing for long periods of time. Patient to wear own compression stockings every day. Exercise regularly Moisturize legs daily. Compression stocking or Garment 20-30 mm/Hg pressure to: - purchase and wear. Other Edema Control Orders/Instructions: - will apply tubigrip size E  double layer to both legs. wear in the morning and remove at night until you get your compression stockings. TIMAKA, BUCKLEY ([id number]) [phone].pdf Page 3 of 7 Electronic Signature(s) Signed: 05/23/2023 5:16:35 PM By: Shawn Stall RN, BSN Signed: 05/24/2023 10:44:47 AM By: Baltazar Najjar MD Entered By: Shawn Stall on 05/23/2023 07:01:27 -------------------------------------------------------------------------------- Problem List Details Patient Name: Date of Service: [place] NSTA NCE C. 05/23/2023 9:30 A M Medical Record Number: [id number] Patient Account Number: [id number] Date of Birth/Sex: Treating RN: 1951-04-09 (72 y.o. Arta Silence Primary Care Provider: [place], Betty Other Clinician: Referring Provider: Treating Provider/Extender: Zaidy Absher [place], Betty Weeks in Treatment: 0 Active Problems ICD-10 Encounter Code Description Active Date MDM Diagnosis I89.0 Lymphedema, not elsewhere classified 05/23/2023 No Yes I87.323 Chronic venous hypertension (idiopathic) with inflammation of bilateral lower 05/23/2023 No Yes extremity L97.228 Non-pressure chronic ulcer of left calf with other specified severity 05/23/2023 No Yes Inactive Problems Resolved Problems Electronic Signature(s) Signed: 05/24/2023 10:44:47 AM By: Baltazar Najjar MD Entered By: Baltazar Najjar on 05/23/2023 07:06:22 -------------------------------------------------------------------------------- Progress Note Details Patient Name: Date of Service: [place], [place] NSTA NCE C. 05/23/2023 9:30 A M Medical Record Number: [id number] Patient Account Number: [id number] Date of Birth/Sex: Treating RN: [birth year] (72 y.o. F) Primary Care Provider: [place], Betty Other Clinician: Referring Provider: Treating Provider/Extender: Donne Robillard [place], Betty Weeks in Treatment: 0 Subjective Chief Complaint Information obtained from Patient Patient presents for treatment of  an open ulcer due to venous insufficiency 05/23/2023; patient returns to clinic with a wound on the left posterior calf History of Present Illness (HPI) ADMISSION Kulesza, Dacoda C ([id number]) [phone].pdf Page 4 of 7 12/16/2022 This is a 72 year old type II diabetic (no recent hemoglobin A1c available for review) also has a history of breast cancer and endometrial carcinoma. She has venous stasis in the bilateral lower extremities and has developed an ulcer on her right leg. Her primary care provider referred her for further evaluation and management. She has been applying topical mupirocin and triamcinolone. She does not wear compression stockings. ABIs were 0.90 on the right and 0.97 on the left. READMISSION 05/23/2023 This is a now 72 year old woman who was  here earlier this year in June and saw Dr. Lady Gary with a wound on her right leg. This had healed by the time she came in the clinic. It was suggested she wear stockings. She had a pair of support stockings from [employer] that she wears intermittently but certainly did not take this suggestion the heart. She developed a recent blister on the left posterior calf. This has healed by the time she came here today. In discussion with the patient she has had edema in her legs for perhaps as long as 8 to 10 years. Past medical history includes type 2 diabetes, chronic venous insufficiency, chronic renal failure, right breast CA, hypertension, hyperlipidemia, type 2 diabetes with angiopathy. We did not repeat her ABIs in clinic today these were normal earlier this year listed above in Dr. America Brown note Patient History Information obtained from Chart. Allergies No Known Allergies Social History Former smoker - quit 1982, Marital Status - Widowed, Alcohol Use - Never, Drug Use - No History, Caffeine Use - Never. Medical History Cardiovascular Patient has history of Hypertension Endocrine Patient has history of Type II  Diabetes Musculoskeletal Patient has history of Rheumatoid Arthritis Neurologic Patient has history of Neuropathy Hospitalization/Surgery History - abdominal hysterectomy. - carpal tunnel release. Medical A Surgical History Notes nd Endocrine SIADH Genitourinary Chronic Renal Failure, CKD Musculoskeletal Degenerative Disc Disease Oncologic Endometrial Cancer, breast cancer Objective Constitutional Sitting or standing Blood Pressure is within target range for patient.. Pulse regular and within target range for patient.Marland Kitchen Respirations regular, non-labored and within target range.. Temperature is normal and within the target range for the patient.Marland Kitchen Appears in no distress. Vitals Time Taken: 9:39 AM, Height: 62 in, Weight: 209 lbs, BMI: 38.2, Temperature: 98.0 F, Pulse: 70 bpm, Respiratory Rate: 20 breaths/min, Blood Pressure: 151/83 mmHg, Capillary Blood Glucose: 122 mg/dl. Respiratory work of breathing is normal. Bilateral breath sounds are clear and equal in all lobes with no wheezes, rales or rhonchi.. Cardiovascular Soft systolic murmur no evidence of heart failure. General Notes: Wound exam; the area on the left posterior calf is healed. The patient has significant bilateral lower extremity edema that is nonpitting. Hemosiderin deposition bilaterally. Skin is becoming adherent to underlying soft tissue. There is no edema above her knees. Assessment Active Problems ICD-10 ADALYNN, KELM ([id number]) [id number] Page 5 of 7 Lymphedema, not elsewhere classified Chronic venous hypertension (idiopathic) with inflammation of bilateral lower extremity Non-pressure chronic ulcer of left calf with other specified severity Plan Discharge From [facility] Services: Discharge from Wound Care Center - Call if any future wound care needs. Wear compression stockings for life. Leg measurements provided. may purchase from elastic therapy or any medical supply store.  Think about sock butler to assist with compression stockings. Compression stockings or compression garments- juxtalite HD from [employer]. Edema Control - Orders / Instructions: Elevate legs to the level of the heart or above for 30 minutes daily and/or when sitting for 3-4 times a day throughout the day. Avoid standing for long periods of time. Patient to wear own compression stockings every day. Exercise regularly Moisturize legs daily. Compression stocking or Garment 20-30 mm/Hg pressure to: - purchase and wear. Other Edema Control Orders/Instructions: - will apply tubigrip size E double layer to both legs. wear in the morning and remove at night until you get your compression stockings. 1. This patient has wounds on her legs now on both sides secondary to lymphedema which in turn is secondary to chronic venous insufficiency. She has nonpitting edema currently. 2. No evidence  of systemic fluid volume overload. 3. I would start with 20/30 stockings which can be standard stockings or external compression stockings and we went this over over this with her. 4. Also counseled for skin moisturizer nightly keeping her legs elevated when sitting etc. Electronic Signature(s) Signed: 05/24/2023 10:44:47 AM By: Baltazar Najjar MD Entered By: Baltazar Najjar on 05/23/2023 07:13:09 -------------------------------------------------------------------------------- HxROS Details Patient Name: Date of Service: [place] NSTA NCE C. 05/23/2023 9:30 A M Medical Record Number: [id number] Patient Account Number: [id number] Date of Birth/Sex: Treating RN: 11-19-1950 (72 y.o. Arta Silence Primary Care Provider: [place], Betty Other Clinician: Referring Provider: Treating Provider/Extender: Emilly Lavey [place], Betty Weeks in Treatment: 0 Information Obtained From Chart Cardiovascular Medical History: Positive for: Hypertension Endocrine Medical History: Positive for: Type II Diabetes Past Medical  History Notes: SIADH Genitourinary Medical History: Past Medical History Notes: Chronic Renal Failure, CKD Musculoskeletal Medical History: Positive for: Rheumatoid Arthritis Heitzenrater, Najma C ([id number]) [id number].pdf Page 6 of 7 Past Medical History Notes: Degenerative Disc Disease Neurologic Medical History: Positive for: Neuropathy Oncologic Medical History: Past Medical History Notes: Endometrial Cancer, breast cancer Immunizations Pneumococcal Vaccine: Received Pneumococcal Vaccination: No Implantable Devices No devices added Hospitalization / Surgery History Type of Hospitalization/Surgery abdominal hysterectomy carpal tunnel release Family and Social History Former smoker - quit 1982; Marital Status - Widowed; Alcohol Use: Never; Drug Use: No History; Caffeine Use: Never; Financial Concerns: No; Food, Clothing or Shelter Needs: No; Support System Lacking: No; Transportation Concerns: No Psychologist, prison and probation services) Signed: 05/23/2023 5:16:35 PM By: Shawn Stall RN, BSN Signed: 05/24/2023 10:44:47 AM By: Baltazar Najjar MD Entered By: Shawn Stall on 05/22/2023 11:18:29 -------------------------------------------------------------------------------- SuperBill Details Patient Name: Date of Service: [place], [place] NSTA NCE C. 05/23/2023 Medical Record Number: [id number] Patient Account Number: [id number] Date of Birth/Sex: Treating RN: 1951-06-04 (72 y.o. Arta Silence Primary Care Provider: [place], Betty Other Clinician: Referring Provider: Treating Provider/Extender: Marcell Pfeifer [place], Betty Weeks in Treatment: 0 Diagnosis Coding ICD-10 Codes Code Description I89.0 Lymphedema, not elsewhere classified I87.323 Chronic venous hypertension (idiopathic) with inflammation of bilateral lower extremity L97.228 Non-pressure chronic ulcer of left calf with other specified severity Facility Procedures : CPT4 Code: [id number] Description:  99213 - WOUND CARE VISIT-LEV 3 EST PT Modifier: Quantity: 1 Physician Procedures : CPT4 Code Description Modifier [id number] - WC PHYS LEVEL 4 - EST PT ICD-10 Diagnosis Description I89.0 Lymphedema, not elsewhere classified I87.323 Chronic venous hypertension (idiopathic) with inflammation of bilateral lower extremity Petrea,  Phelan C ([id number]) [id number] L97.228 Non-pressure chronic ulcer of left calf with other specified severity Quantity: 1 7.pdf Page 7 of 7 Electronic Signature(s) Signed: 05/24/2023 10:44:47 AM By: Baltazar Najjar MD Entered By: Baltazar Najjar on 05/23/2023 07:13:37

## 2023-05-24 NOTE — Progress Notes (Signed)
Audrey [name] ([id number]) [phone].pdf Page 1 of 6 Visit Report for 05/23/2023 Allergy List Details Patient Name: Date of Service: Audrey [name], Audrey [name]. 05/23/2023 9:30 A M Medical Record Number: [id number] Patient Account Number: [id number] Date of Birth/Sex: Treating RN: Jun 21, 1951 (72 y.o. Audrey [name] Primary Care Audrey [name]: [place], Betty Other Clinician: Referring Syan Cullimore: Treating Devan Danzer/Extender: Robson, Michael [place], Betty Weeks in Treatment: 0 Allergies Active Allergies No Known Allergies Allergy Notes Electronic Signature(s) Signed: 05/23/2023 5:16:35 PM By: Shawn Stall RN, BSN Entered By: Shawn Stall on 05/22/2023 11:16:11 -------------------------------------------------------------------------------- Arrival Information Details Patient Name: Date of Service: Audrey [name], Audrey [name] [name]. 05/23/2023 9:30 A M Medical Record Number: [id number] Patient Account Number: [id number] Date of Birth/Sex: Treating RN: 1951-07-06 (72 y.o. F) Primary Care Jansel Vonstein: [place], Betty Other Clinician: Referring Lynnmarie Lovett: Treating Audrey [name]/Extender: Robson, Michael [place], Betty Weeks in Treatment: 0 Visit Information Patient Arrived: Ambulatory Arrival Time: 09:39 Accompanied By: daughter Transfer Assistance: None Patient Identification Verified: Yes Secondary Verification Process Completed: Yes History Since Last Visit Added or deleted any medications: No Any new allergies or adverse reactions: No Had a fall or experienced change in activities of daily living that may affect risk of falls: No Signs or symptoms of abuse/neglect since last visito No Hospitalized since last visit: No Implantable device outside of the clinic excluding cellular tissue based products placed in the center since last visit: No Electronic Signature(s) Signed: 05/23/2023 11:08:18 AM By: Dayton Scrape Entered By: Dayton Scrape on 05/23/2023 06:39:35 Audrey [name], Audrey [name]  ([id number]) [id number] Page 2 of 6 -------------------------------------------------------------------------------- Clinic Level of Care Assessment Details Patient Name: Date of Service: Audrey [name] [name] [name]. 05/23/2023 9:30 A M Medical Record Number: [id number] Patient Account Number: [id number] Date of Birth/Sex: Treating RN: 1951-05-16 (72 y.o. Audrey [name] Primary Care Ashaun Gaughan: [place], Betty Other Clinician: Referring Akyah Lagrange: Treating Audrey [name]/Extender: Robson, Michael [place], Betty Weeks in Treatment: 0 Clinic Level of Care Assessment Items TOOL 2 Quantity Score X- 1 0 Use when only an EandM is performed on the INITIAL visit ASSESSMENTS - Nursing Assessment / Reassessment X- 1 20 General Physical Exam (combine w/ comprehensive assessment (listed just below) when performed on new pt. evals) X- 1 25 Comprehensive Assessment (HX, ROS, Risk Assessments, Wounds Hx, etc.) ASSESSMENTS - Wound and Skin A ssessment / Reassessment []  - 0 Simple Wound Assessment / Reassessment - one wound []  - 0 Complex Wound Assessment / Reassessment - multiple wounds X- 1 10 Dermatologic / Skin Assessment (not related to wound area) ASSESSMENTS - Ostomy and/or Continence Assessment and Care []  - 0 Incontinence Assessment and Management []  - 0 Ostomy Care Assessment and Management (repouching, etc.) PROCESS - Coordination of Care X - Simple Patient / Family Education for ongoing care 1 15 []  - 0 Complex (extensive) Patient / Family Education for ongoing care X- 1 10 Staff obtains Chiropractor, Records, T Results / Process Orders est []  - 0 Staff telephones HHA, Nursing Homes / Clarify orders / etc []  - 0 Routine Transfer to another Facility (non-emergent condition) []  - 0 Routine Hospital Admission (non-emergent condition) []  - 0 New Admissions / Manufacturing engineer / Ordering NPWT Apligraf, etc. , []  - 0 Emergency Hospital Admission (emergent  condition) X- 1 10 Simple Discharge Coordination []  - 0 Complex (extensive) Discharge Coordination PROCESS - Special Needs []  - 0 Pediatric / Minor Patient Management []  - 0 Isolation Patient Management []  - 0 Hearing / Language / Visual special needs []  - 0 Assessment of Community assistance (transportation, D/[name] planning,  etc.) []  - 0 Additional assistance / Altered mentation []  - 0 Support Surface(s) Assessment (bed, cushion, seat, etc.) INTERVENTIONS - Wound Cleansing / Measurement []  - 0 Wound Imaging (photographs - any number of wounds) []  - 0 Wound Tracing (instead of photographs) []  - 0 Simple Wound Measurement - one wound []  - 0 Complex Wound Measurement - multiple wounds []  - 0 Simple Wound Cleansing - one wound []  - 0 Complex Wound Cleansing - multiple wounds INTERVENTIONS - Wound Dressings []  - 0 Small Wound Dressing one or multiple wounds Audrey [name] ([id number]) [id number] Page 3 of 6 []  - 0 Medium Wound Dressing one or multiple wounds []  - 0 Large Wound Dressing one or multiple wounds []  - 0 Application of Medications - injection INTERVENTIONS - Miscellaneous []  - 0 External ear exam []  - 0 Specimen Collection (cultures, biopsies, blood, body fluids, etc.) []  - 0 Specimen(s) / Culture(s) sent or taken to Lab for analysis []  - 0 Patient Transfer (multiple staff / Nurse, adult / Similar devices) []  - 0 Simple Staple / Suture removal (25 or less) []  - 0 Complex Staple / Suture removal (26 or more) []  - 0 Hypo / Hyperglycemic Management (close monitor of Blood Glucose) []  - 0 Ankle / Brachial Index (ABI) - do not check if billed separately Has the patient been seen at the hospital within the last three years: Yes Total Score: 90 Level Of Care: New/Established - Level 3 Electronic Signature(s) Signed: 05/23/2023 5:16:35 PM By: Shawn Stall RN, BSN Entered By: Shawn Stall on 05/23/2023  07:02:30 -------------------------------------------------------------------------------- Lower Extremity Assessment Details Patient Name: Date of Service: Audrey [name], Audrey [name] [name]. 05/23/2023 9:30 A M Medical Record Number: [id number] Patient Account Number: [id number] Date of Birth/Sex: Treating RN: [birth year] (72 y.o. Debara Pickett, Yvonne Kendall Primary Care Breanne Olvera: [place], Betty Other Clinician: Referring Michall Noffke: Treating Emeree Mahler/Extender: Robson, Michael [place], Betty Weeks in Treatment: 0 Edema Assessment Assessed: Kyra Searles: Yes] [Right: Yes] Edema: [Left: Yes] [Right: Yes] Calf Left: Right: Point of Measurement: 29 cm From Medial Instep 45 cm 44 cm Ankle Left: Right: Point of Measurement: 11 cm From Medial Instep 27.5 cm 27.5 cm Knee To Floor Left: Right: From Medial Instep 41 cm 41 cm Vascular Assessment Pulses: Dorsalis Pedis Palpable: [Left:Yes] [Right:Yes] Extremity colors, hair growth, and conditions: Extremity Color: [Left:Hyperpigmented] [Right:Hyperpigmented] Hair Growth on Extremity: [Left:No] [Right:No] Temperature of Extremity: [Left:Warm] Capillary Refill: [Left:< 3 seconds] [Right:< 3 seconds] Dependent Rubor: [Left:No] [Right:No] Blanched when Elevated: [Left:No] [Right:No] Audrey [name], Audrey [name] ([id number]) [Left:Yes] [Right:[id number] Page 4 of 6 Yes] Toe Nail Assessment Left: Right: Thick: Yes Yes Discolored: Yes Yes Deformed: Yes Yes Improper Length and Hygiene: Yes Yes Electronic Signature(s) Signed: 05/23/2023 5:16:35 PM By: Shawn Stall RN, BSN Entered By: Shawn Stall on 05/23/2023 07:06:11 -------------------------------------------------------------------------------- Multi-Disciplinary Care Plan Details Patient Name: Date of Service: Audrey Celestine [name] [name]. 05/23/2023 9:30 A M Medical Record Number: [id number] Patient Account Number: [id number] Date of Birth/Sex: Treating RN: 06/05/1951 (72 y.o. Audrey [name] Primary  Care Belinda Bringhurst: [place], Betty Other Clinician: Referring Lee Kuang: Treating Muhamed Luecke/Extender: Robson, Michael [place], Betty Weeks in Treatment: 0 Active Inactive Electronic Signature(s) Signed: 05/23/2023 5:16:35 PM By: Shawn Stall RN, BSN Entered By: Shawn Stall on 05/23/2023 07:01:44 -------------------------------------------------------------------------------- Non-Wound Condition Assessment Details Patient Name: Date of Service: Audrey [name], Audrey [name] [name]. 05/23/2023 9:30 A M Medical Record Number: [id number] Patient Account Number: [id number] Date of Birth/Sex: Treating RN: [birth year] (72 y.o. Audrey [name] Primary Care Tnia Anglada: [place], Betty Other Clinician: Referring Elaf Clauson: Treating  Aarianna Hoadley/Extender: Robson, Michael [place], Kathie Rhodes Weeks in Treatment: 0 Non-Wound Condition: Condition: Lymphedema Location: Leg Side: Bilateral Periwound Skin Texture Texture Color No Abnormalities Noted: No No Abnormalities Noted: No Callus: No Atrophie Blanche: No Crepitus: No Cyanosis: No Excoriation: No Ecchymosis: No Friable: No Erythema: No Induration: No Hemosiderin Staining: Yes Rash: No Mottled: No Scarring: No Pallor: No Rubor: No Moisture No Abnormalities Noted: No Dry / Scaly: No Audrey [name], Audrey [name] ([id number]) [id number] Page 5 of 6 Maceration: No Lymphedema Assessment Symptoms: (check all that apply) Edema - Unable to Control Stage 2- Irreversible persistent edema and skin changes (spontaneous and Lymphedema Staging stagnant) Notes cobblestone appearance. Electronic Signature(s) Signed: 05/23/2023 5:16:35 PM By: Shawn Stall RN, BSN Entered By: Shawn Stall on 05/23/2023 07:03:35 -------------------------------------------------------------------------------- Pain Assessment Details Patient Name: Date of Service: Audrey [name], Audrey [name] [name]. 05/23/2023 9:30 A M Medical Record Number: [id number] Patient Account Number:  [id number] Date of Birth/Sex: Treating RN: April 29, 1951 (72 y.o. F) Primary Care Silvie Obremski: [place], Betty Other Clinician: Referring Casey Maxfield: Treating Sharonda Llamas/Extender: Robson, Michael [place], Betty Weeks in Treatment: 0 Active Problems Location of Pain Severity and Description of Pain Patient Has Paino No Site Locations Pain Management and Medication Current Pain Management: Electronic Signature(s) Signed: 05/23/2023 11:08:18 AM By: Dayton Scrape Entered By: Dayton Scrape on 05/23/2023 06:40:51 -------------------------------------------------------------------------------- Patient/Caregiver Education Details Patient Name: Date of Service: Audrey Celestine [name] [name]. 11/12/2024andnbsp9:30 A M Medical Record Number: [id number] Patient Account Number: [id number] Audrey [name], Audrey [name] ([id number]) [phone].pdf Page 6 of 6 Date of Birth/Gender: Treating RN: May 10, 1951 (72 y.o. Audrey [name] Primary Care Physician: [place], Betty Other Clinician: Referring Physician: Treating Physician/Extender: Robson, Michael [place], Betty Weeks in Treatment: 0 Education Assessment Education Provided To: Patient Education Topics Provided Venous: Handouts: Controlling Swelling with Compression Stockings Methods: Explain/Verbal Responses: Reinforcements needed Electronic Signature(s) Signed: 05/23/2023 5:16:35 PM By: Shawn Stall RN, BSN Entered By: Shawn Stall on 05/23/2023 07:01:53 -------------------------------------------------------------------------------- Vitals Details Patient Name: Date of Service: Audrey [name], Audrey [name] [name]. 05/23/2023 9:30 A M Medical Record Number: [id number] Patient Account Number: [id number] Date of Birth/Sex: Treating RN: 1950-08-20 (72 y.o. F) Primary Care Kensy Blizard: [place], Betty Other Clinician: Referring Elleen Coulibaly: Treating Alicya Bena/Extender: Robson, Michael [place], Betty Weeks in Treatment: 0 Vital Signs Time Taken: 09:39 Temperature (F):  98.0 Height (in): 62 Pulse (bpm): 70 Weight (lbs): 209 Respiratory Rate (breaths/min): 20 Body Mass Index (BMI): 38.2 Blood Pressure (mmHg): 151/83 Capillary Blood Glucose (mg/dl): 338 Reference Range: 80 - 120 mg / dl Electronic Signature(s) Signed: 05/23/2023 11:08:18 AM By: Dayton Scrape Entered By: Dayton Scrape on 05/23/2023 06:40:34

## 2023-05-26 DIAGNOSIS — N1832 Chronic kidney disease, stage 3b: Secondary | ICD-10-CM | POA: Diagnosis not present

## 2023-05-26 DIAGNOSIS — D0511 Intraductal carcinoma in situ of right breast: Secondary | ICD-10-CM | POA: Diagnosis not present

## 2023-05-26 DIAGNOSIS — Z79811 Long term (current) use of aromatase inhibitors: Secondary | ICD-10-CM | POA: Diagnosis not present

## 2023-06-13 DIAGNOSIS — L97329 Non-pressure chronic ulcer of left ankle with unspecified severity: Secondary | ICD-10-CM | POA: Diagnosis not present

## 2023-06-13 DIAGNOSIS — I872 Venous insufficiency (chronic) (peripheral): Secondary | ICD-10-CM | POA: Diagnosis not present

## 2023-06-19 DIAGNOSIS — I129 Hypertensive chronic kidney disease with stage 1 through stage 4 chronic kidney disease, or unspecified chronic kidney disease: Secondary | ICD-10-CM | POA: Diagnosis not present

## 2023-06-19 DIAGNOSIS — N184 Chronic kidney disease, stage 4 (severe): Secondary | ICD-10-CM | POA: Diagnosis not present

## 2023-06-19 DIAGNOSIS — I5189 Other ill-defined heart diseases: Secondary | ICD-10-CM | POA: Diagnosis not present

## 2023-06-19 DIAGNOSIS — E1122 Type 2 diabetes mellitus with diabetic chronic kidney disease: Secondary | ICD-10-CM | POA: Diagnosis not present

## 2023-06-19 DIAGNOSIS — D631 Anemia in chronic kidney disease: Secondary | ICD-10-CM | POA: Diagnosis not present

## 2023-06-19 DIAGNOSIS — N2581 Secondary hyperparathyroidism of renal origin: Secondary | ICD-10-CM | POA: Diagnosis not present

## 2023-09-15 DIAGNOSIS — Z1231 Encounter for screening mammogram for malignant neoplasm of breast: Secondary | ICD-10-CM | POA: Diagnosis not present

## 2023-09-15 DIAGNOSIS — R92323 Mammographic fibroglandular density, bilateral breasts: Secondary | ICD-10-CM | POA: Diagnosis not present

## 2023-10-16 DIAGNOSIS — N184 Chronic kidney disease, stage 4 (severe): Secondary | ICD-10-CM | POA: Diagnosis not present

## 2023-10-23 ENCOUNTER — Other Ambulatory Visit: Payer: Self-pay

## 2023-10-23 DIAGNOSIS — E1122 Type 2 diabetes mellitus with diabetic chronic kidney disease: Secondary | ICD-10-CM | POA: Diagnosis not present

## 2023-10-23 DIAGNOSIS — N2581 Secondary hyperparathyroidism of renal origin: Secondary | ICD-10-CM | POA: Diagnosis not present

## 2023-10-23 DIAGNOSIS — N189 Chronic kidney disease, unspecified: Secondary | ICD-10-CM | POA: Diagnosis not present

## 2023-10-23 DIAGNOSIS — D631 Anemia in chronic kidney disease: Secondary | ICD-10-CM | POA: Diagnosis not present

## 2023-10-23 DIAGNOSIS — I129 Hypertensive chronic kidney disease with stage 1 through stage 4 chronic kidney disease, or unspecified chronic kidney disease: Secondary | ICD-10-CM | POA: Diagnosis not present

## 2023-10-23 DIAGNOSIS — N184 Chronic kidney disease, stage 4 (severe): Secondary | ICD-10-CM | POA: Diagnosis not present

## 2023-10-23 DIAGNOSIS — M79605 Pain in left leg: Secondary | ICD-10-CM

## 2023-10-23 DIAGNOSIS — R609 Edema, unspecified: Secondary | ICD-10-CM | POA: Diagnosis not present

## 2023-10-30 NOTE — Progress Notes (Unsigned)
 [facility] [place]  ASSESSMENT / PLAN: Audrey [name] is a 73 y.o. female with chronic venous insufficiency, diabetic neuropathy.  She has no evidence of hemodynamically significant peripheral arterial disease on physical exam or noninvasive testing. Compression and elevation for swelling.  Follow-up with me as needed  CHIEF COMPLAINT: Swelling and numbness  HISTORY OF PRESENT ILLNESS: Audrey [name] is a 73 y.o. female referred to clinic for evaluation of multiple lower extremity complaints.  The patient has numbness typical of diabetic neuropathy in bilateral feet.  She has significant swelling in bilateral calves and ankles.  She has history of venous ulceration treated with compression therapy at the wound clinic.  She has not had any further ulceration.  She does not have any symptoms typical of symptomatic peripheral arterial disease.  She has no claudication, ischemic rest pain, or ischemic ulceration about the foot.   Past Medical History:  Diagnosis Date   Arthritis    Diabetes mellitus    dx early mid 2000.  Type 2    Not taking metformin ....   Headache(784.0)    Heart murmur    was told this earlier..did an echo 1-1.5 yrs ago.  ?? the cardio    Hypertension    PONV (postoperative nausea and vomiting)     Past Surgical History:  Procedure Laterality Date   ABDOMINAL HYSTERECTOMY     BREAST BIOPSY     right   CARPAL TUNNEL RELEASE     right hand   CERVICAL FUSION     2 yrs ago   at [facility]   CHOLECYSTECTOMY      No family history on file.  Social History   Socioeconomic History   Marital status: Widowed    Spouse name: Not on file   Number of children: Not on file   Years of education: Not on file   Highest education level: Not on file  Occupational History   Not on file  Tobacco Use   Smoking status: Former    Current packs/day: 0.00    Types: Cigarettes    Quit date: 01/05/1981    Years since quitting: 42.8   Smokeless  tobacco: Not on file  Substance and Sexual Activity   Alcohol use: No   Drug use: No   Sexual activity: Not on file  Other Topics Concern   Not on file  Social History Narrative   Not on file   Social Drivers of Health   Financial Resource Strain: Low Risk  (09/30/2022)   Received from [facility], [facility]   Overall Financial Resource Strain (CARDIA)    Difficulty of Paying Living Expenses: Not hard at all  Food Insecurity: No Food Insecurity (09/30/2022)   Received from [facility], [facility]   Hunger Vital Sign    Worried About Running Out of Food in the Last Year: Never true    Ran Out of Food in the Last Year: Never true  Transportation Needs: No Transportation Needs (09/30/2022)   [facility], [facility]   PRAPARE - Transportation    Lack of Transportation (Medical): No    Lack of Transportation (Non-Medical): No  Physical Activity: Insufficiently Active (09/16/2021)   Received from [facility], [facility]   Exercise Vital Sign    Days of Exercise per Week: 3 days    Minutes of Exercise per Session: 10 min  Stress: No Stress Concern Present (09/16/2021)   Received from [facility], [facility]  [employer] of Occupational Health - Occupational Stress Questionnaire    Feeling of Stress : Not at all  Social Connections: Unknown (11/17/2021)   Received from [facility], [facility]   Social Network    Social Network: Not on file  Intimate Partner Violence: Unknown (10/13/2021)   Received from [facility], [facility]   HITS    Physically Hurt: Not on file    Insult or Talk Down To: Not on file    Threaten Physical Harm: Not on file    Scream or Curse: Not on file    No Known Allergies  Current Outpatient Medications  Medication Sig Dispense Refill   acetaminophen  (TYLENOL ) 650 MG CR tablet Take 650 mg by mouth every 8 (eight) hours as needed for pain.      aspirin 81 MG chewable tablet Chew 81 mg by mouth.      benazepril  (LOTENSIN ) 40 MG tablet Take 1 tablet (40 mg total) by mouth daily. 30 tablet 0   Biotin  2500 MCG CAPS Take 2,500 mcg by mouth daily.      Calcium-Magnesium -Zinc 167-83-8 MG TABS Take 1 tablet by mouth 2 (two) times daily.     cetirizine (ZYRTEC) 10 MG tablet Take 10 mg by mouth daily as needed for allergies.      Cholecalciferol (VITAMIN D-3) 5000 UNITS TABS Take 1 tablet by mouth daily.     Magnesium  Hydroxide (MAGNESIA PO) Take 250 mg by mouth.     metFORMIN  (GLUCOPHAGE ) 500 MG tablet Take 1 tablet (500 mg total) by mouth 2 (two) times daily with a meal. For diabetes 60 tablet 0   metoprolol  succinate (TOPROL -XL) 100 MG 24 hr tablet Take 1 tablet (100 mg total) by mouth daily. 30 tablet 0   mometasone (NASONEX) 50 MCG/ACT nasal spray Place 2 sprays into the nose daily as needed (allergies).      Multiple Vitamin (MULTIVITAMIN WITH MINERALS) TABS Take 1 tablet by mouth daily.     Potassium Gluconate 595 MG CAPS Take 1 capsule by mouth daily.      No current facility-administered medications for this visit.    PHYSICAL EXAM  Elderly woman in no distress Regular rate and rhythm Unlabored breathing Palpable dorsalis pedis pulses bilaterally 2+ edema about the foot, ankle, calf Healed venous ulcer bilateral ankles  PERTINENT LABORATORY AND RADIOLOGIC DATA  Most recent CBC    Latest Ref Rng & Units 01/21/2015    8:16 PM 01/18/2015    9:57 AM 01/16/2015   10:46 AM  CBC  WBC 4.0 - 10.5 K/uL 7.4  10.5  17.0   Hemoglobin 12.0 - 15.0 g/dL 78.2  95.6  21.3   Hematocrit 36.0 - 46.0 % 31.8  36.1  33.9   Platelets 150 - 400 K/uL 324  300  208      Most recent CMP    Latest Ref Rng & Units 01/21/2015    8:16 PM 01/20/2015    5:38 AM 01/18/2015    9:57 AM  CMP  Glucose 65 - 99 mg/dL 086  578  469   BUN 6 - 20 mg/dL 14  12  14    Creatinine 0.44 - 1.00 mg/dL 6.29  5.28  4.13   Sodium 135 - 145 mmol/L 132  135  129   Potassium 3.5 - 5.1 mmol/L 3.9  3.7  3.2   Chloride 101 - 111  mmol/L 94  95  89   CO2 22 - 32 mmol/L 30  30  29   Calcium 8.9 - 10.3 mg/dL 9.2  9.6  8.9    Hgb G9F MFr Bld (%)  Date Value  01/06/2015 7.2 (H)     +-------+-----------+-----------+------------+------------+  ABI/TBIToday's ABIToday's TBIPrevious ABIPrevious TBI  +-------+-----------+-----------+------------+------------+  Right 1.05       0.6        1.01                      +-------+-----------+-----------+------------+------------+  Left  0.99       0.6        0.95                      +-------+-----------+-----------+------------+------------+   Heber Little. Edgardo Goodwill, MD FACS [facility] Phone Number: [phone] 11/01/2023 8:16 AM   Total time spent on preparing this encounter including chart review, data review, collecting history, examining the patient, coordinating care for this new patient, 60 minutes.  Portions of this report may have been transcribed using voice recognition software.  Every effort has been made to ensure accuracy; however, inadvertent computerized transcription errors may still be present.

## 2023-10-31 ENCOUNTER — Ambulatory Visit (INDEPENDENT_AMBULATORY_CARE_PROVIDER_SITE_OTHER): Payer: 59 | Admitting: Vascular Surgery

## 2023-10-31 ENCOUNTER — Ambulatory Visit (INDEPENDENT_AMBULATORY_CARE_PROVIDER_SITE_OTHER): Payer: 59

## 2023-10-31 DIAGNOSIS — M79604 Pain in right leg: Secondary | ICD-10-CM

## 2023-10-31 DIAGNOSIS — I872 Venous insufficiency (chronic) (peripheral): Secondary | ICD-10-CM

## 2023-10-31 DIAGNOSIS — M79605 Pain in left leg: Secondary | ICD-10-CM

## 2023-11-01 LAB — VAS US ABI WITH/WO TBI
Left ABI: 0.99
Right ABI: 1.05

## 2023-11-24 DIAGNOSIS — Z79811 Long term (current) use of aromatase inhibitors: Secondary | ICD-10-CM | POA: Diagnosis not present

## 2023-11-24 DIAGNOSIS — N1832 Chronic kidney disease, stage 3b: Secondary | ICD-10-CM | POA: Diagnosis not present

## 2023-11-24 DIAGNOSIS — D0511 Intraductal carcinoma in situ of right breast: Secondary | ICD-10-CM | POA: Diagnosis not present

## 2023-11-24 DIAGNOSIS — M8589 Other specified disorders of bone density and structure, multiple sites: Secondary | ICD-10-CM | POA: Diagnosis not present

## 2023-11-24 DIAGNOSIS — I10 Essential (primary) hypertension: Secondary | ICD-10-CM | POA: Diagnosis not present

## 2023-12-14 DIAGNOSIS — E1122 Type 2 diabetes mellitus with diabetic chronic kidney disease: Secondary | ICD-10-CM | POA: Diagnosis not present

## 2023-12-14 DIAGNOSIS — Z0000 Encounter for general adult medical examination without abnormal findings: Secondary | ICD-10-CM | POA: Diagnosis not present

## 2023-12-14 DIAGNOSIS — E782 Mixed hyperlipidemia: Secondary | ICD-10-CM | POA: Diagnosis not present

## 2023-12-14 DIAGNOSIS — I10 Essential (primary) hypertension: Secondary | ICD-10-CM | POA: Diagnosis not present

## 2023-12-14 DIAGNOSIS — Z1331 Encounter for screening for depression: Secondary | ICD-10-CM | POA: Diagnosis not present

## 2023-12-14 DIAGNOSIS — I739 Peripheral vascular disease, unspecified: Secondary | ICD-10-CM | POA: Diagnosis not present

## 2023-12-14 DIAGNOSIS — E1142 Type 2 diabetes mellitus with diabetic polyneuropathy: Secondary | ICD-10-CM | POA: Diagnosis not present

## 2023-12-14 DIAGNOSIS — Z6837 Body mass index (BMI) 37.0-37.9, adult: Secondary | ICD-10-CM | POA: Diagnosis not present

## 2024-01-23 DIAGNOSIS — I10 Essential (primary) hypertension: Secondary | ICD-10-CM | POA: Diagnosis not present

## 2024-01-23 DIAGNOSIS — R110 Nausea: Secondary | ICD-10-CM | POA: Diagnosis not present

## 2024-01-23 DIAGNOSIS — Z6837 Body mass index (BMI) 37.0-37.9, adult: Secondary | ICD-10-CM | POA: Diagnosis not present

## 2024-01-23 DIAGNOSIS — E1122 Type 2 diabetes mellitus with diabetic chronic kidney disease: Secondary | ICD-10-CM | POA: Diagnosis not present

## 2024-01-26 ENCOUNTER — Other Ambulatory Visit (HOSPITAL_COMMUNITY): Payer: Self-pay | Admitting: Physician Assistant

## 2024-01-26 DIAGNOSIS — R109 Unspecified abdominal pain: Secondary | ICD-10-CM

## 2024-02-05 ENCOUNTER — Ambulatory Visit (HOSPITAL_COMMUNITY)
Admission: RE | Admit: 2024-02-05 | Discharge: 2024-02-05 | Disposition: A | Discharge status: Home / Self Care | Source: Ambulatory Visit | Attending: Physician Assistant | Admitting: Physician Assistant

## 2024-02-05 DIAGNOSIS — R109 Unspecified abdominal pain: Secondary | ICD-10-CM | POA: Insufficient documentation

## 2024-02-12 DIAGNOSIS — N184 Chronic kidney disease, stage 4 (severe): Secondary | ICD-10-CM | POA: Diagnosis not present

## 2024-02-19 DIAGNOSIS — I129 Hypertensive chronic kidney disease with stage 1 through stage 4 chronic kidney disease, or unspecified chronic kidney disease: Secondary | ICD-10-CM | POA: Diagnosis not present

## 2024-02-19 DIAGNOSIS — N1832 Chronic kidney disease, stage 3b: Secondary | ICD-10-CM | POA: Diagnosis not present

## 2024-02-19 DIAGNOSIS — R609 Edema, unspecified: Secondary | ICD-10-CM | POA: Diagnosis not present

## 2024-02-19 DIAGNOSIS — E1122 Type 2 diabetes mellitus with diabetic chronic kidney disease: Secondary | ICD-10-CM | POA: Diagnosis not present

## 2024-03-18 DIAGNOSIS — E1122 Type 2 diabetes mellitus with diabetic chronic kidney disease: Secondary | ICD-10-CM | POA: Diagnosis not present

## 2024-03-18 DIAGNOSIS — E782 Mixed hyperlipidemia: Secondary | ICD-10-CM | POA: Diagnosis not present

## 2024-03-18 DIAGNOSIS — Z6836 Body mass index (BMI) 36.0-36.9, adult: Secondary | ICD-10-CM | POA: Diagnosis not present

## 2024-03-18 DIAGNOSIS — Z23 Encounter for immunization: Secondary | ICD-10-CM | POA: Diagnosis not present

## 2024-03-18 DIAGNOSIS — I10 Essential (primary) hypertension: Secondary | ICD-10-CM | POA: Diagnosis not present

## 2024-06-10 DIAGNOSIS — B351 Tinea unguium: Secondary | ICD-10-CM | POA: Diagnosis not present

## 2024-06-10 DIAGNOSIS — E1142 Type 2 diabetes mellitus with diabetic polyneuropathy: Secondary | ICD-10-CM | POA: Diagnosis not present
